# Patient Record
Sex: Female | Born: 1940 | Race: White | Hispanic: No | Marital: Married | Smoking: Former smoker
Health system: Southern US, Community
[De-identification: ages and names within clinical notes are randomized; demographics above are authoritative.]

## PROBLEM LIST (undated history)

## (undated) DIAGNOSIS — I10 Essential (primary) hypertension: Secondary | ICD-10-CM

## (undated) DIAGNOSIS — E119 Type 2 diabetes mellitus without complications: Secondary | ICD-10-CM

## (undated) HISTORY — PX: ABDOMINAL HYSTERECTOMY: SHX81

## (undated) HISTORY — PX: BACK SURGERY: SHX140

---

## 2004-03-02 ENCOUNTER — Ambulatory Visit: Payer: Self-pay | Admitting: Unknown Physician Specialty

## 2004-04-22 ENCOUNTER — Ambulatory Visit: Payer: Self-pay | Admitting: Surgery

## 2005-01-11 ENCOUNTER — Ambulatory Visit: Payer: Self-pay | Admitting: Ophthalmology

## 2005-03-06 ENCOUNTER — Ambulatory Visit: Payer: Self-pay | Admitting: Unknown Physician Specialty

## 2005-06-08 ENCOUNTER — Ambulatory Visit: Payer: Self-pay | Admitting: Gastroenterology

## 2005-07-26 ENCOUNTER — Ambulatory Visit: Payer: Self-pay | Admitting: Unknown Physician Specialty

## 2005-08-16 ENCOUNTER — Ambulatory Visit: Payer: Self-pay | Admitting: Unknown Physician Specialty

## 2006-01-17 ENCOUNTER — Ambulatory Visit: Payer: Self-pay | Admitting: Anesthesiology

## 2006-01-23 ENCOUNTER — Ambulatory Visit: Payer: Self-pay | Admitting: Anesthesiology

## 2006-03-07 ENCOUNTER — Ambulatory Visit: Payer: Self-pay | Admitting: Unknown Physician Specialty

## 2007-05-01 ENCOUNTER — Ambulatory Visit: Payer: Self-pay | Admitting: Unknown Physician Specialty

## 2008-05-04 ENCOUNTER — Ambulatory Visit: Payer: Self-pay | Admitting: Unknown Physician Specialty

## 2009-05-19 ENCOUNTER — Ambulatory Visit: Payer: Self-pay | Admitting: Ophthalmology

## 2009-09-14 ENCOUNTER — Ambulatory Visit: Payer: Self-pay | Admitting: Unknown Physician Specialty

## 2010-09-19 ENCOUNTER — Ambulatory Visit: Payer: Self-pay | Admitting: Unknown Physician Specialty

## 2011-10-11 ENCOUNTER — Ambulatory Visit: Payer: Self-pay | Admitting: Unknown Physician Specialty

## 2013-02-19 ENCOUNTER — Ambulatory Visit: Payer: Self-pay | Admitting: Internal Medicine

## 2014-02-26 DIAGNOSIS — E113299 Type 2 diabetes mellitus with mild nonproliferative diabetic retinopathy without macular edema, unspecified eye: Secondary | ICD-10-CM | POA: Insufficient documentation

## 2014-02-26 DIAGNOSIS — E559 Vitamin D deficiency, unspecified: Secondary | ICD-10-CM | POA: Insufficient documentation

## 2014-02-26 DIAGNOSIS — E1165 Type 2 diabetes mellitus with hyperglycemia: Secondary | ICD-10-CM | POA: Insufficient documentation

## 2014-02-26 DIAGNOSIS — I10 Essential (primary) hypertension: Secondary | ICD-10-CM | POA: Insufficient documentation

## 2014-05-11 DIAGNOSIS — D649 Anemia, unspecified: Secondary | ICD-10-CM | POA: Insufficient documentation

## 2014-05-29 DIAGNOSIS — E782 Mixed hyperlipidemia: Secondary | ICD-10-CM | POA: Insufficient documentation

## 2014-05-29 DIAGNOSIS — E538 Deficiency of other specified B group vitamins: Secondary | ICD-10-CM | POA: Insufficient documentation

## 2015-10-20 ENCOUNTER — Other Ambulatory Visit: Payer: Self-pay | Admitting: Internal Medicine

## 2015-10-20 DIAGNOSIS — Z1239 Encounter for other screening for malignant neoplasm of breast: Secondary | ICD-10-CM

## 2015-11-05 ENCOUNTER — Ambulatory Visit
Admission: RE | Admit: 2015-11-05 | Discharge: 2015-11-05 | Disposition: A | Discharge status: Home / Self Care | Payer: Medicare Other | Source: Ambulatory Visit | Attending: Internal Medicine | Admitting: Internal Medicine

## 2015-11-05 ENCOUNTER — Other Ambulatory Visit: Payer: Self-pay | Admitting: Internal Medicine

## 2015-11-05 DIAGNOSIS — Z1231 Encounter for screening mammogram for malignant neoplasm of breast: Secondary | ICD-10-CM | POA: Diagnosis present

## 2015-11-05 DIAGNOSIS — Z1239 Encounter for other screening for malignant neoplasm of breast: Secondary | ICD-10-CM

## 2016-02-04 ENCOUNTER — Other Ambulatory Visit: Payer: Self-pay | Admitting: Internal Medicine

## 2016-02-04 DIAGNOSIS — R1319 Other dysphagia: Secondary | ICD-10-CM

## 2016-02-28 ENCOUNTER — Ambulatory Visit
Admission: RE | Admit: 2016-02-28 | Discharge: 2016-02-28 | Disposition: A | Discharge status: Home / Self Care | Payer: Medicare Other | Source: Ambulatory Visit | Attending: Internal Medicine | Admitting: Internal Medicine

## 2016-02-28 DIAGNOSIS — R1310 Dysphagia, unspecified: Secondary | ICD-10-CM | POA: Insufficient documentation

## 2016-02-28 DIAGNOSIS — R1319 Other dysphagia: Secondary | ICD-10-CM

## 2016-02-28 NOTE — Therapy (Signed)
[facility] DIAGNOSTIC RADIOLOGY [address] [place], [place], [address] Phone: [phone]   Fax:     Modified Barium Swallow  Patient Details  Name: Rebecca PeersJustina R Ball MRN: [id number] Date of Birth: [birth year] No Data Recorded  Encounter Date: 02/28/2016      End of Session - 02/28/16 1349    Visit Number 1   Number of Visits 1   Date for SLP Re-Evaluation 02/28/16   SLP Start Time 1230   SLP Stop Time  1315   SLP Time Calculation (min) 45 min   Activity Tolerance Patient tolerated treatment well      No past medical history on file.  No past surgical history on file.  There were no vitals filed for this visit.   Subjective: Patient behavior: (alertness, ability to follow instructions, etc.): Patient is fully independent for cognition and communication  Chief complaint: Food sticking at mid-sternal level, primarily dry meat   Objective:  Radiological Procedure: A videoflouroscopic evaluation of oral-preparatory, reflex initiation, and pharyngeal phases of the swallow was performed; as well as a screening of the upper esophageal phase.  I. POSTURE: Upright in MBS chair  II. VIEW: Lateral  III. COMPENSATORY STRATEGIES: N/A  IV. BOLUSES ADMINISTERED:   Thin Liquid: 1 small, 5 rapid, consecutive   Nectar-thick Liquid: 1 moderate size    Puree: 3 teaspoon presentations   Mechanical Soft: 1/4 graham cracker in applesauce  V. RESULTS OF EVALUATION: A. ORAL PREPARATORY PHASE: (The lips, tongue, and velum are observed for strength and coordination)       **Overall Severity Rating: Within normal limits  B. SWALLOW INITIATION/REFLEX: (The reflex is normal if "triggered" by the time the bolus reached the base of the tongue)  **Overall Severity Rating: Within normal limits  C. PHARYNGEAL PHASE: (Pharyngeal function is normal if the bolus shows rapid, smooth, and continuous transit through the pharynx and there is no pharyngeal  residue after the swallow)  **Overall Severity Rating: Within normal limits  D. LARYNGEAL PENETRATION: (Material entering into the laryngeal inlet/vestibule but not aspirated) None  E. ASPIRATION: None  F. ESOPHAGEAL PHASE: (Screening of the upper esophagus) Slight coating of esophagus with barium impregnated applesauce  ASSESSMENT: This 75 year old woman, with complaint of food (primarily dry meats) sticking in her esophagus and requiring regurgitation, is presenting with normal oropharyngeal swallowing.  Oral control of the bolus including oral hold, rotary mastication, and anterior to posterior transfer are within normal limits. Timing of the pharyngeal swallow is within normal limits.  Aspects of the pharyngeal stage of swallowing including tongue base retraction, hyolaryngeal excursion, epiglottic inversion, duration/amplitude of UES opening, and laryngeal vestibule closure at the height of the swallow are within normal limits.  There is no pharyngeal residue.  There was no observed laryngeal penetration or aspiration.  The patient's symptoms do not appear to be due to oropharyngeal dysphagia.  Recommend referral to GI.  PLAN/RECOMMENDATIONS:   A. Diet: Regular; moisten and soften as needed   B. Swallowing Precautions: Avoid foods that get stuck   C. Recommended consultation to: GI   D. Therapy recommendations: N/A   E. Results and recommendations were discussed with the patient immediately following the study and the final report routed to the referring MD.      Esophageal dysphagia - Plan: DG OP Swallowing Func-Medicare/Speech Path, DG OP Swallowing Func-Medicare/Speech Path      G-Codes - 02/28/16 1350    Functional Assessment Tool Used MBS, clinical judgment  Functional Limitations Swallowing   Swallow Current Status [phone](G8996) At least 1 percent but less than 20 percent impaired, limited or restricted   Swallow Goal Status ([id number](G8997) At least 1 percent but less than 20 percent  impaired, limited or restricted   Swallow Discharge Status [phone](G8998) At least 1 percent but less than 20 percent impaired, limited or restricted          Problem List There are no active problems to display for this patient.  Dollene PrimroseSusan G Charon Smedberg, MS/CCC- SLP  Leandrew KoyanagiAbernathy, Susie 02/28/2016, 1:51 PM  [facility] DIAG[facility]STIC RADIOLOGY [address] [place], [place], [address] Phone: [phone]   Fax:     Name: Rebecca PeersJustina R Ball MRN: [id number] Date of Birth: [birth year]

## 2016-06-08 DIAGNOSIS — S32030A Wedge compression fracture of third lumbar vertebra, initial encounter for closed fracture: Secondary | ICD-10-CM | POA: Insufficient documentation

## 2016-06-08 DIAGNOSIS — M8588 Other specified disorders of bone density and structure, other site: Secondary | ICD-10-CM | POA: Insufficient documentation

## 2016-06-08 DIAGNOSIS — M5442 Lumbago with sciatica, left side: Secondary | ICD-10-CM | POA: Insufficient documentation

## 2016-12-15 DIAGNOSIS — E7800 Pure hypercholesterolemia, unspecified: Secondary | ICD-10-CM | POA: Insufficient documentation

## 2017-07-02 ENCOUNTER — Inpatient Hospital Stay
Admission: EM | Admit: 2017-07-02 | Discharge: 2017-07-07 | DRG: 853 | Disposition: A | Discharge status: Home Health | Payer: Medicare Other | Attending: Internal Medicine | Admitting: Internal Medicine

## 2017-07-02 ENCOUNTER — Emergency Department: Payer: Medicare Other

## 2017-07-02 ENCOUNTER — Encounter: Payer: Self-pay | Admitting: Emergency Medicine

## 2017-07-02 ENCOUNTER — Other Ambulatory Visit: Payer: Self-pay

## 2017-07-02 DIAGNOSIS — Z7982 Long term (current) use of aspirin: Secondary | ICD-10-CM | POA: Diagnosis not present

## 2017-07-02 DIAGNOSIS — E860 Dehydration: Secondary | ICD-10-CM | POA: Diagnosis present

## 2017-07-02 DIAGNOSIS — N179 Acute kidney failure, unspecified: Secondary | ICD-10-CM | POA: Diagnosis not present

## 2017-07-02 DIAGNOSIS — Z794 Long term (current) use of insulin: Secondary | ICD-10-CM | POA: Diagnosis not present

## 2017-07-02 DIAGNOSIS — K612 Anorectal abscess: Secondary | ICD-10-CM | POA: Diagnosis present

## 2017-07-02 DIAGNOSIS — Z888 Allergy status to other drugs, medicaments and biological substances status: Secondary | ICD-10-CM

## 2017-07-02 DIAGNOSIS — E1110 Type 2 diabetes mellitus with ketoacidosis without coma: Secondary | ICD-10-CM | POA: Diagnosis present

## 2017-07-02 DIAGNOSIS — E785 Hyperlipidemia, unspecified: Secondary | ICD-10-CM | POA: Diagnosis present

## 2017-07-02 DIAGNOSIS — A419 Sepsis, unspecified organism: Principal | ICD-10-CM | POA: Diagnosis present

## 2017-07-02 DIAGNOSIS — I10 Essential (primary) hypertension: Secondary | ICD-10-CM | POA: Diagnosis present

## 2017-07-02 DIAGNOSIS — Z8619 Personal history of other infectious and parasitic diseases: Secondary | ICD-10-CM

## 2017-07-02 DIAGNOSIS — N170 Acute kidney failure with tubular necrosis: Secondary | ICD-10-CM | POA: Diagnosis present

## 2017-07-02 DIAGNOSIS — L0231 Cutaneous abscess of buttock: Secondary | ICD-10-CM | POA: Diagnosis not present

## 2017-07-02 DIAGNOSIS — E0810 Diabetes mellitus due to underlying condition with ketoacidosis without coma: Secondary | ICD-10-CM | POA: Diagnosis not present

## 2017-07-02 DIAGNOSIS — H9190 Unspecified hearing loss, unspecified ear: Secondary | ICD-10-CM | POA: Diagnosis present

## 2017-07-02 DIAGNOSIS — Z885 Allergy status to narcotic agent status: Secondary | ICD-10-CM | POA: Diagnosis not present

## 2017-07-02 DIAGNOSIS — A46 Erysipelas: Secondary | ICD-10-CM

## 2017-07-02 DIAGNOSIS — Z6836 Body mass index (BMI) 36.0-36.9, adult: Secondary | ICD-10-CM

## 2017-07-02 DIAGNOSIS — L03317 Cellulitis of buttock: Secondary | ICD-10-CM | POA: Diagnosis not present

## 2017-07-02 DIAGNOSIS — E876 Hypokalemia: Secondary | ICD-10-CM | POA: Diagnosis present

## 2017-07-02 HISTORY — DX: Essential (primary) hypertension: I10

## 2017-07-02 HISTORY — DX: Type 2 diabetes mellitus without complications: E11.9

## 2017-07-02 LAB — BASIC METABOLIC PANEL
ANION GAP: 16 — AB (ref 5–15)
BUN: 65 mg/dL — AB (ref 6–20)
CHLORIDE: 91 mmol/L — AB (ref 101–111)
CO2: 21 mmol/L — ABNORMAL LOW (ref 22–32)
Calcium: 8.2 mg/dL — ABNORMAL LOW (ref 8.9–10.3)
Creatinine, Ser: 3.26 mg/dL — ABNORMAL HIGH (ref 0.44–1.00)
GFR, EST AFRICAN AMERICAN: 15 mL/min — AB (ref >60)
GFR, EST NON AFRICAN AMERICAN: 13 mL/min — AB (ref >60)
Glucose, Bld: 269 mg/dL — ABNORMAL HIGH (ref 65–99)
POTASSIUM: 2.8 mmol/L — AB (ref 3.5–5.1)
SODIUM: 128 mmol/L — AB (ref 135–145)

## 2017-07-02 LAB — CBC WITH DIFFERENTIAL/PLATELET
BASOS ABS: 0 10*3/uL (ref 0–0.1)
Basophils Relative: 0 %
EOS PCT: 0 %
Eosinophils Absolute: 0 10*3/uL (ref 0–0.7)
HEMATOCRIT: 35.6 % (ref 35.0–47.0)
HEMOGLOBIN: 11.2 g/dL — AB (ref 12.0–16.0)
LYMPHS ABS: 0.5 10*3/uL — AB (ref 1.0–3.6)
LYMPHS PCT: 3 %
MCH: 22.7 pg — ABNORMAL LOW (ref 26.0–34.0)
MCHC: 31.5 g/dL — ABNORMAL LOW (ref 32.0–36.0)
MCV: 72.2 fL — ABNORMAL LOW (ref 80.0–100.0)
Monocytes Absolute: 0.8 10*3/uL (ref 0.2–0.9)
Monocytes Relative: 4 %
NEUTROS ABS: 18.7 10*3/uL — AB (ref 1.4–6.5)
NEUTROS PCT: 93 %
PLATELETS: 302 10*3/uL (ref 150–440)
RBC: 4.93 MIL/uL (ref 3.80–5.20)
RDW: 17 % — ABNORMAL HIGH (ref 11.5–14.5)
WBC: 20.1 10*3/uL — AB (ref 3.6–11.0)

## 2017-07-02 LAB — LACTIC ACID, PLASMA: Lactic Acid, Venous: 2.6 mmol/L (ref 0.5–1.9)

## 2017-07-02 MED ORDER — ONDANSETRON HCL 4 MG/2ML IJ SOLN
4.0000 mg | Freq: Once | INTRAMUSCULAR | Status: AC
  Administered 2017-07-02: 4 mg via INTRAVENOUS
  Filled 2017-07-02: qty 2

## 2017-07-02 MED ORDER — SODIUM CHLORIDE 0.9 % IV BOLUS (SEPSIS)
1000.0000 mL | Freq: Once | INTRAVENOUS | Status: AC
  Administered 2017-07-02: 1000 mL via INTRAVENOUS

## 2017-07-02 MED ORDER — VANCOMYCIN HCL IN DEXTROSE 1-5 GM/200ML-% IV SOLN
1000.0000 mg | Freq: Once | INTRAVENOUS | Status: AC
  Administered 2017-07-02: 1000 mg via INTRAVENOUS
  Filled 2017-07-02: qty 200

## 2017-07-02 MED ORDER — VANCOMYCIN HCL 10 G IV SOLR: 2000.0000 mg | Freq: Once | INTRAVENOUS | Status: DC

## 2017-07-02 MED ORDER — INSULIN ASPART 100 UNIT/ML ~~LOC~~ SOLN: 0.0000 [IU] | Freq: Every day | SUBCUTANEOUS | Status: DC

## 2017-07-02 MED ORDER — VANCOMYCIN HCL IN DEXTROSE 750-5 MG/150ML-% IV SOLN
750.0000 mg | INTRAVENOUS | Status: DC
  Filled 2017-07-02: qty 150

## 2017-07-02 NOTE — ED Notes (Signed)
See triage note  Presents with abscess to buttocks  States the area started to drain 1-2 days ago  Large red swollen area noted to inside right buttocks

## 2017-07-02 NOTE — Progress Notes (Addendum)
Pharmacy Antibiotic Note  [name] is a 77 y.o. female admitted on 07/02/2017 with cellulitis with abscess.  Pharmacy has been consulted for vancomycin and Zosyn dosing.  Plan: DW 70kg  Vd 49L kei 0.018 hr-1  T1/2 39 hours Vancomycin 750 mg q 36 hours ordered with stacked dosing. Level before 4th dose. Goal trough 15-20.  Zosyn 3.375 grams q 12 hours ordered.  Height: 5\' 4"  (162.6 cm) Weight: 206 lb (93.4 kg) IBW/kg (Calculated) : 54.7  Temp (24hrs), Avg:99.2 F (37.3 C), Min:98.9 F (37.2 C), Max:99.4 F (37.4 C)  Recent Labs  Lab 07/02/17 1813 07/02/17 1909  WBC 20.1*  --   CREATININE 3.26*  --   LATICACIDVEN  --  2.6*    Estimated Creatinine Clearance: 16.3 mL/min (A) (by C-G formula based on SCr of 3.26 mg/dL (H)).    Allergies  Allergen Reactions  . Penicillins Anaphylaxis  . Oxycodone Nausea And Vomiting  . Propoxyphene Nausea And Vomiting    Antimicrobials this admission: Vancomycin 3/18  >>    >>   Dose adjustments this admission:   Microbiology results: 3/18 BCx: pending 3/18 WoundCx: pending    Thank you for allowing pharmacy to be a part of this patient's care.  Daralyn Bert S 07/02/2017 11:56 PM

## 2017-07-02 NOTE — ED Provider Notes (Signed)
[facility] Emergency Department Provider Note ____________________________________________  Time seen: 1732  I have reviewed the triage vital signs and the nursing notes.  HISTORY  Chief Complaint  Abscess  HPI Rebecca Ball is a 77 y.o. female presents to the ED [facility]  The patient presented herself there initially for evaluation of a right buttocks abscess.  Patient describes there is been there for approximately a week.  In that time she is noted nausea, vomiting, fever, and chills.  She notes the area is weeping at this time.  She denies any chest pain or shortness of breath.  Patient has attempted to take over-the-counter Tylenol or Motrin but notes that her nausea and has been severe.  She is also had decreased oral intake secondary to it.  She describes a remote history of staph infection when she was a child.  She denies any recurrent abscesses or cellulitis to the buttocks.  Past Medical History:  Diagnosis Date  . Diabetes mellitus without complication (HCC)   . Hypertension    There are no active problems to display for this patient.  History reviewed. No pertinent surgical history.  Prior to Admission medications   Medication Sig Start Date End Date Taking? Authorizing Provider  aspirin 81 MG chewable tablet Chew 81 mg by mouth daily.   Yes [provider]  hydrochlorothiazide (HYDRODIURIL) 25 MG tablet Take 25 mg by mouth daily.   Yes [provider]  insulin aspart (NOVOLOG) 100 UNIT/ML injection Inject into the skin 3 (three) times daily before meals.   Yes [provider]  metFORMIN (GLUCOPHAGE) 1000 MG tablet Take 1,000 mg by mouth 2 (two) times daily with a meal.   Yes [provider]  metoprolol succinate (TOPROL XL) 25 MG 24 hr tablet Take 25 mg by mouth daily.   Yes [provider]  simvastatin (ZOCOR) 20 MG tablet Take 20 mg by mouth daily.   Yes [provider]    Allergies Penicillins and Oxycodone  Family History  Problem Relation Age of Onset  . Breast cancer Paternal Aunt     Social History Social History   Tobacco Use  . Smoking status: Not on file  Substance Use Topics  . Alcohol use: Not on file  . Drug use: Not on file    Review of Systems  Constitutional: Positive for fever. Cardiovascular: Negative for chest pain. Respiratory: Negative for shortness of breath. Gastrointestinal: Negative for abdominal pain and diarrhea. Reports nausea and vomiting . Genitourinary: Negative for dysuria. Musculoskeletal: Negative for back pain. Skin: Negative for rash.  Right buttocks abscess as above. Neurological: Negative for headaches, focal weakness or numbness. ____________________________________________  PHYSICAL EXAM:  VITAL SIGNS: ED Triage Vitals  Enc Vitals Group     BP 07/02/17 1657 117/61     Pulse Rate 07/02/17 1657 (!) 102     Resp 07/02/17 1657 18     Temp 07/02/17 1657 98.9 F (37.2 C)     Temp Source 07/02/17 1657 Oral     SpO2 07/02/17 1657 96 %     Weight 07/02/17 1658 206 lb (93.4 kg)     Height 07/02/17 1658 5\' 4"  (1.626 m)     Head Circumference --      Peak Flow --      Pain Score 07/02/17 1658 6     Pain Loc --      Pain Edu? --      Excl. in  GC? --     Constitutional: Alert and oriented. Well appearing and in no distress. Head: Normocephalic and atraumatic. Eyes: Conjunctivae are normal. Normal extraocular movements Cardiovascular: Normal rate, regular rhythm. Normal distal pulses. Respiratory: Normal respiratory effort. No wheezes/rales/rhonchi. Gastrointestinal: Soft and nontender. No distention. Musculoskeletal: Nontender with normal range of motion in all extremities.  Neurologic:  Normal gait without ataxia. Normal speech and language. No gross focal neurologic deficits are appreciated. Skin:  Skin is warm, dry and intact. No rash noted.  Right buttocks is noted to have a  large, firm, well demarcated area of in duration and erythema.  The erythema extends from the gluteal cleft near the rectum and perineum through the to the lateral aspect of the right buttocks.  There is also noted to be some necrotic appearing tissue near the rectum.  The skin is weeping.  No focal abscess, pointing, or fluctuance is appreciated. ____________________________________________   LABS (pertinent positives/negatives) Labs Reviewed  BASIC METABOLIC PANEL - Abnormal; Notable for the following components:      Result Value   Sodium 128 (*)    Potassium 2.8 (*)    Chloride 91 (*)    CO2 21 (*)    Glucose, Bld 269 (*)    BUN 65 (*)    Creatinine, Ser 3.26 (*)    Calcium 8.2 (*)    GFR calc non Af Amer 13 (*)    GFR calc Af Amer 15 (*)    Anion gap 16 (*)    All other components within normal limits  CBC WITH DIFFERENTIAL/PLATELET - Abnormal; Notable for the following components:   WBC 20.1 (*)    Hemoglobin 11.2 (*)    MCV 72.2 (*)    MCH 22.7 (*)    MCHC 31.5 (*)    RDW 17.0 (*)    Neutro Abs 18.7 (*)    Lymphs Abs 0.5 (*)    All other components within normal limits  LACTIC ACID, PLASMA - Abnormal; Notable for the following components:   Lactic Acid, Venous 2.6 (*)    All other components within normal limits  CULTURE, BLOOD (ROUTINE X 2)  CULTURE, BLOOD (ROUTINE X 2)  AEROBIC CULTURE (SUPERFICIAL SPECIMEN)  LACTIC ACID, PLASMA  URINALYSIS, COMPLETE (UACMP) WITH MICROSCOPIC  ____________________________________________   RADIOLOGY  US Pelvic Ltd  IMPRESSION: 1.1 x 1.0 x 1.1 cm complex hypoechoic collection positioned within the medial aspect of the right buttock, approximately 4-5 mm deep to the skin, with associated linear tract extending towards the skin superficially. Finding most consistent with a small abscess with draining sinus tract. Surrounding soft tissue swelling edema suggestive of associated  cellulitis. ____________________________________________  PROCEDURES  Procedures Zofran 4 mg IVP NS 1000 ml IVP Vancomycin 1000 mg IVPB ____________________________________________  INITIAL IMPRESSION / ASSESSMENT AND PLAN / ED COURSE  Patient with ED evaluation of a one-week complaint of a right buttocks abscess and sialitis.  Patient was found on exam to have a large cellulitis to the right buttocks on presentation.  Her labs revealed an acute renal failure a leukocytosis and elevated lactic acid.  Ultrasound of the area in question reveals a very small central abscess with surrounding cellulitis.  As such, plan of care is to have the patient admitted to the hospitalist service for IV antibiotics and fluid hydration. ____________________________________________  FINAL CLINICAL IMPRESSION(S) / ED DIAGNOSES  Final diagnoses:  Cellulitis of buttock  Erysipelas  Cellulitis and abscess of buttock  Acute renal failure, unspecified acute renal failure type (  HCC)      Anastasios Melander, Charlesetta Ivory, PA-C 07/02/17 2122    Phineas Semen, MD 07/02/17 [phone]

## 2017-07-02 NOTE — ED Notes (Signed)
Lab called with a critical high lactic acid of 2.6. PA was notified.

## 2017-07-02 NOTE — ED Triage Notes (Signed)
Abscess R buttock x 1 week. States is draining

## 2017-07-02 NOTE — H&P (Signed)
[facility] [place] [facility]   PATIENT NAME: Rebecca Ball    MR#:  [id number]  DATE OF BIRTH:  1940/09/11  DATE OF ADMISSION:  07/02/2017  PRIMARY CARE PHYSICIAN: Leotis ShamesSingh, Jasmine, MD   REQUESTING/REFERRING PHYSICIAN:   CHIEF COMPLAINT:   Chief Complaint  Patient presents with  . Abscess    HISTORY OF PRESENT ILLNESS: Rebecca SkinnerJustina Frimpong  is a 77 y.o. female with a known history of DM2 and HTN. She presented to emergency room for right buttock swelling and tenderness, going on for the past week or so.  Patient denies any recent falls or trauma to the area.  She admits to subjective fever and chills at home, she did not check her temperature. Her p.o. intake has been poor for the past 3-4 days, due to not feeling well.  Patient has not been taking her metformin and insulin doses as she is supposed to, as she was afraid that the blood sugar would drop due to poor p.o. Intake.  She tried ibuprofen and Tylenol without any relief at home. Upon evaluation in the emergency room, she was found in sepsis and DKA. Blood test done emergency room are remarkable for elevated WBC at 20,000, lactic acid 2.6, anion gap at 16.  Creatinine is 3.26 and BUN is 65.  Sodium is 128 and potassium was 2.8.  Blood sugar is elevated at 269. Pelvis ultrasound, reviewed by myself, shows 1.1 x 1.0 x 1.1 cm complex hypoechoic collection positioned within the medial aspect of the right buttock, approximately 4-5 mm deep to the skin, with associated linear tract extending towards the skin superficially. Patient is admitted for further evaluation and treatment.  PAST MEDICAL HISTORY:   Past Medical History:  Diagnosis Date  . Diabetes mellitus without complication (HCC)   . Hypertension     PAST SURGICAL HISTORY: History reviewed. No pertinent surgical history.  SOCIAL HISTORY:  Social History   Tobacco Use  . Smoking status: Not on file  Substance Use Topics  . Alcohol use: Not on  file    FAMILY HISTORY:  Family History  Problem Relation Age of Onset  . Breast cancer Paternal Aunt     DRUG ALLERGIES:  Allergies  Allergen Reactions  . Penicillins Anaphylaxis  . Oxycodone Nausea And Vomiting  . Propoxyphene Nausea And Vomiting    REVIEW OF SYSTEMS:   CONSTITUTIONAL: Positive for subjective fever/chills, fatigue and generalized weakness. EYES: No vision changes.  EARS, NOSE, AND THROAT: No tinnitus or ear pain.  RESPIRATORY: No cough, shortness of breath, wheezing or hemoptysis.  CARDIOVASCULAR: No chest pain, orthopnea, edema.  GASTROINTESTINAL: No nausea, vomiting, diarrhea or abdominal pain.  GENITOURINARY: No dysuria, hematuria.  ENDOCRINE: No polyuria, nocturia,  HEMATOLOGY: No bleeding SKIN: RT buttock tenderness and swelling. MUSCULOSKELETAL: No joint pain or arthritis.   NEUROLOGIC: No focal weakness.  PSYCHIATRY: No anxiety or depression.   MEDICATIONS AT HOME:  Prior to Admission medications   Medication Sig Start Date End Date Taking? Authorizing Provider  aspirin 81 MG chewable tablet Chew 81 mg by mouth daily.   Yes [provider]  hydrochlorothiazide (HYDRODIURIL) 25 MG tablet Take 25 mg by mouth daily.   Yes [provider]  insulin aspart (NOVOLOG) 100 UNIT/ML injection Inject into the skin 3 (three) times daily before meals.   Yes [provider]  metFORMIN (GLUCOPHAGE) 1000 MG tablet Take 1,000 mg by mouth 2 (two) times daily with a meal.   Yes [provider]  metoprolol succinate (TOPROL XL) 25 MG 24 hr tablet Take 25 mg by mouth daily.   Yes [provider]  simvastatin (ZOCOR) 20 MG tablet Take 20 mg by mouth daily.   Yes [provider]      PHYSICAL EXAMINATION:   VITAL SIGNS: Blood pressure (!) 105/49, pulse 96, temperature 99.4 F (37.4 C), temperature source Oral, resp. rate 18, height 5\' 4"  (1.626 m), weight 93.4 kg (206 lb), SpO2 98 %.  GENERAL:  77 y.o.-year-old  patient lying in the bed with no acute distress.  EYES: Pupils equal, round, reactive to light and accommodation. No scleral icterus.  HEENT: Head atraumatic, normocephalic. Oropharynx and nasopharynx clear.  NECK:  Supple, no jugular venous distention. No thyroid enlargement, no tenderness.  LUNGS: Reduced breath sounds bilaterally, no wheezing, rales,rhonchi or crepitation. No use of accessory muscles of respiration.  CARDIOVASCULAR: S1, S2 normal. No S3/S4.  ABDOMEN: Soft, nontender, nondistended. Bowel sounds present. No organomegaly or mass.  EXTREMITIES: No pedal edema, cyanosis, or clubbing.  NEUROLOGIC: No focal weakness. Gait not checked, due to generalized weakness.  PSYCHIATRIC: The patient is alert and oriented x 3.  SKIN: Right buttock is noted with a large area of erythema and induration extending from the gluteal cleft to the lateral aspect of the right buttock.  The skin is weeping, but no bleeding or discharge noted.  No focal abscess or fluctuance appreciated by palpation.  LABORATORY PANEL:   CBC Recent Labs  Lab 07/02/17 1813  WBC 20.1*  HGB 11.2*  HCT 35.6  PLT 302  MCV 72.2*  MCH 22.7*  MCHC 31.5*  RDW 17.0*  LYMPHSABS 0.5*  MONOABS 0.8  EOSABS 0.0  BASOSABS 0.0   ------------------------------------------------------------------------------------------------------------------  Chemistries  Recent Labs  Lab 07/02/17 1813  NA 128*  K 2.8*  CL 91*  CO2 21*  GLUCOSE 269*  BUN 65*  CREATININE 3.26*  CALCIUM 8.2*   ------------------------------------------------------------------------------------------------------------------ estimated creatinine clearance is 16.3 mL/min (A) (by C-G formula based on SCr of 3.26 mg/dL (H)). ------------------------------------------------------------------------------------------------------------------ No results for input(s): TSH, T4TOTAL, T3FREE, THYROIDAB in the last 72 hours.  Invalid input(s):  FREET3   Coagulation profile No results for input(s): INR, PROTIME in the last 168 hours. ------------------------------------------------------------------------------------------------------------------- No results for input(s): DDIMER in the last 72 hours. -------------------------------------------------------------------------------------------------------------------  Cardiac Enzymes No results for input(s): CKMB, TROPONINI, MYOGLOBIN in the last 168 hours.  Invalid input(s): CK ------------------------------------------------------------------------------------------------------------------ Invalid input(s): POCBNP  ---------------------------------------------------------------------------------------------------------------  Urinalysis No results found for: COLORURINE, APPEARANCEUR, LABSPEC, PHURINE, GLUCOSEU, HGBUR, BILIRUBINUR, KETONESUR, PROTEINUR, UROBILINOGEN, NITRITE, LEUKOCYTESUR   RADIOLOGY: US Pelvis Limited (transabdominal Only)  Result Date: 07/02/2017 CLINICAL DATA:  Initial evaluation for right buttock abscess for 1 week. EXAM: LIMITED ULTRASOUND OF PELVIS TECHNIQUE: Limited transabdominal ultrasound examination of the pelvis was performed. COMPARISON:  Prior CT from 08/16/2005. FINDINGS: Targeted ultrasound of in area of concern at the medial aspect of the right buttock was performed. Ultrasound demonstrates a complex hypoechoic area/collection measuring 1.1 x 1.0 x 1.1 cm. There appears to be a communicating sinus tract extending towards the skin. Collection is positioned approximately 4-5 mm deep to the skin. No other discrete fluid collections identified. Per technologist note, patient's entire right buttock is erythematous and swollen, suggesting cellulitis. Ill-defined soft tissue edema is seen throughout the remaining visualized areas of the right buttock. IMPRESSION: 1.1 x 1.0 x 1.1 cm complex hypoechoic collection positioned within the medial aspect of the  right buttock, approximately 4-5 mm deep to the skin, with associated linear tract  extending towards the skin superficially. Finding most consistent with a small abscess with draining sinus tract. Surrounding soft tissue swelling edema suggestive of associated cellulitis. Electronically Signed   By: Rise Mu M.D.   On: 07/02/2017 20:55    EKG: No orders found for this or any previous visit.  IMPRESSION AND PLAN:  1.  Sepsis, secondary to right buttock abscess.  Will start patient on antibiotics, Zosyn and vancomycin.  We will start fluid resuscitation.  Admit patient to intensive care unit.  Continue to monitor clinically closely and follow lactic acid level.  2.  Right buttock abscess, will start IV antibiotics Zosyn and vancomycin.  Infectious disease and general surgery are consulted for further evaluation and treatment. 3.  DKA.  Will start patient on insulin drip and admitted to intensive care unit for close monitoring.  4.  Acute renal failure, likely prerenal, related to dehydration and infectious process.  We will start aggressive IV hydration and monitor kidney function closely.  Avoid nephrotoxic medications.  5.  Hypertension, stable, restart home medications. 6   Hyperlipidemia.  Continue statin therapy.  All the records are reviewed and case discussed with ED provider. Management plans discussed with the patient, family and they are in agreement.  CODE STATUS: Code Status History    This patient does not have a recorded code status. Please follow your organizational policy for patients in this situation.       TOTAL TIME TAKING CARE OF THIS PATIENT: 50 minutes.    Cammy Copa M.D on 07/02/2017 at 11:16 PM  Between 7am to 6pm - Pager - [phone]  After 6pm go to [email or web address] - password [email or web address] [facility]  [facility]  Office  [phone]  CC: Primary care physician; Leotis Shames, MD

## 2017-07-03 ENCOUNTER — Encounter: Payer: Self-pay | Admitting: *Deleted

## 2017-07-03 ENCOUNTER — Other Ambulatory Visit: Payer: Self-pay

## 2017-07-03 DIAGNOSIS — E0810 Diabetes mellitus due to underlying condition with ketoacidosis without coma: Secondary | ICD-10-CM

## 2017-07-03 DIAGNOSIS — N179 Acute kidney failure, unspecified: Secondary | ICD-10-CM

## 2017-07-03 DIAGNOSIS — E1110 Type 2 diabetes mellitus with ketoacidosis without coma: Secondary | ICD-10-CM | POA: Diagnosis present

## 2017-07-03 LAB — CBC
HCT: 32.4 % — ABNORMAL LOW (ref 35.0–47.0)
Hemoglobin: 10.2 g/dL — ABNORMAL LOW (ref 12.0–16.0)
MCH: 22.8 pg — AB (ref 26.0–34.0)
MCHC: 31.5 g/dL — AB (ref 32.0–36.0)
MCV: 72.4 fL — AB (ref 80.0–100.0)
PLATELETS: 256 10*3/uL (ref 150–440)
RBC: 4.47 MIL/uL (ref 3.80–5.20)
RDW: 16.8 % — ABNORMAL HIGH (ref 11.5–14.5)
WBC: 15.2 10*3/uL — ABNORMAL HIGH (ref 3.6–11.0)

## 2017-07-03 LAB — GLUCOSE, CAPILLARY
GLUCOSE-CAPILLARY: 136 mg/dL — AB (ref 65–99)
GLUCOSE-CAPILLARY: 156 mg/dL — AB (ref 65–99)
GLUCOSE-CAPILLARY: 162 mg/dL — AB (ref 65–99)
GLUCOSE-CAPILLARY: 172 mg/dL — AB (ref 65–99)
GLUCOSE-CAPILLARY: 182 mg/dL — AB (ref 65–99)
Glucose-Capillary: 144 mg/dL — ABNORMAL HIGH (ref 65–99)
Glucose-Capillary: 152 mg/dL — ABNORMAL HIGH (ref 65–99)
Glucose-Capillary: 126 mg/dL — ABNORMAL HIGH (ref 65–99)
Glucose-Capillary: 137 mg/dL — ABNORMAL HIGH (ref 65–99)
Glucose-Capillary: 178 mg/dL — ABNORMAL HIGH (ref 65–99)
Glucose-Capillary: 189 mg/dL — ABNORMAL HIGH (ref 65–99)
Glucose-Capillary: 224 mg/dL — ABNORMAL HIGH (ref 65–99)

## 2017-07-03 LAB — BASIC METABOLIC PANEL
Anion gap: 14 (ref 5–15)
Anion gap: 12 (ref 5–15)
BUN: 64 mg/dL — ABNORMAL HIGH (ref 6–20)
BUN: 62 mg/dL — AB (ref 6–20)
CALCIUM: 7.4 mg/dL — AB (ref 8.9–10.3)
CO2: 19 mmol/L — AB (ref 22–32)
CO2: 20 mmol/L — ABNORMAL LOW (ref 22–32)
CREATININE: 2.76 mg/dL — AB (ref 0.44–1.00)
CREATININE: 2.55 mg/dL — AB (ref 0.44–1.00)
Calcium: 7.6 mg/dL — ABNORMAL LOW (ref 8.9–10.3)
Chloride: 97 mmol/L — ABNORMAL LOW (ref 101–111)
Chloride: 103 mmol/L (ref 101–111)
GFR calc non Af Amer: 16 mL/min — ABNORMAL LOW (ref >60)
GFR calc non Af Amer: 17 mL/min — ABNORMAL LOW (ref >60)
GFR, EST AFRICAN AMERICAN: 18 mL/min — AB (ref >60)
GFR, EST AFRICAN AMERICAN: 20 mL/min — AB (ref >60)
GLUCOSE: 174 mg/dL — AB (ref 65–99)
Glucose, Bld: 146 mg/dL — ABNORMAL HIGH (ref 65–99)
POTASSIUM: 3 mmol/L — AB (ref 3.5–5.1)
Potassium: 2.7 mmol/L — CL (ref 3.5–5.1)
SODIUM: 135 mmol/L (ref 135–145)
Sodium: 130 mmol/L — ABNORMAL LOW (ref 135–145)

## 2017-07-03 LAB — HEMOGLOBIN A1C
HEMOGLOBIN A1C: 6.8 % — AB (ref 4.8–5.6)
Mean Plasma Glucose: 148.46 mg/dL

## 2017-07-03 LAB — LACTIC ACID, PLASMA: Lactic Acid, Venous: 1.7 mmol/L (ref 0.5–1.9)

## 2017-07-03 LAB — MRSA PCR SCREENING: MRSA by PCR: NEGATIVE

## 2017-07-03 LAB — MAGNESIUM: Magnesium: 1.4 mg/dL — ABNORMAL LOW (ref 1.7–2.4)

## 2017-07-03 LAB — PROCALCITONIN: Procalcitonin: 4.48 ng/mL

## 2017-07-03 MED ORDER — ASPIRIN 81 MG PO CHEW
81.0000 mg | CHEWABLE_TABLET | Freq: Every day | ORAL | Status: DC
  Administered 2017-07-03 – 2017-07-07 (×5): 81 mg via ORAL
  Filled 2017-07-03 (×5): qty 1

## 2017-07-03 MED ORDER — SODIUM CHLORIDE 0.9 % IV SOLN: INTRAVENOUS | Status: DC

## 2017-07-03 MED ORDER — PREMIER PROTEIN SHAKE
11.0000 [oz_av] | Freq: Two times a day (BID) | ORAL | Status: DC
  Administered 2017-07-04 – 2017-07-07 (×7): 11 [oz_av] via ORAL

## 2017-07-03 MED ORDER — INSULIN ASPART 100 UNIT/ML ~~LOC~~ SOLN: 0.0000 [IU] | Freq: Three times a day (TID) | SUBCUTANEOUS | Status: DC

## 2017-07-03 MED ORDER — SODIUM CHLORIDE 0.9 % IV SOLN
INTRAVENOUS | Status: DC
  Administered 2017-07-03 – 2017-07-04 (×3): via INTRAVENOUS

## 2017-07-03 MED ORDER — DEXTROSE-NACL 5-0.45 % IV SOLN
INTRAVENOUS | Status: DC
  Administered 2017-07-03: 01:00:00 via INTRAVENOUS

## 2017-07-03 MED ORDER — METOPROLOL SUCCINATE ER 25 MG PO TB24
25.0000 mg | ORAL_TABLET | Freq: Every day | ORAL | Status: DC
  Administered 2017-07-03 – 2017-07-07 (×3): 25 mg via ORAL
  Filled 2017-07-03 (×5): qty 1

## 2017-07-03 MED ORDER — ACETAMINOPHEN 650 MG RE SUPP: 650.0000 mg | Freq: Four times a day (QID) | RECTAL | Status: DC | PRN

## 2017-07-03 MED ORDER — INSULIN GLARGINE 100 UNIT/ML ~~LOC~~ SOLN
10.0000 [IU] | Freq: Every day | SUBCUTANEOUS | Status: DC
  Administered 2017-07-03 – 2017-07-05 (×3): 10 [IU] via SUBCUTANEOUS
  Filled 2017-07-03 (×4): qty 0.1

## 2017-07-03 MED ORDER — HYDROCODONE-ACETAMINOPHEN 5-325 MG PO TABS
1.0000 | ORAL_TABLET | ORAL | Status: DC | PRN
  Administered 2017-07-04 – 2017-07-05 (×3): 1 via ORAL
  Filled 2017-07-03 (×2): qty 1
  Filled 2017-07-03 (×2): qty 2

## 2017-07-03 MED ORDER — PIPERACILLIN-TAZOBACTAM 3.375 G IVPB
3.3750 g | Freq: Two times a day (BID) | INTRAVENOUS | Status: DC
  Administered 2017-07-03 – 2017-07-05 (×5): 3.375 g via INTRAVENOUS
  Filled 2017-07-03 (×5): qty 50

## 2017-07-03 MED ORDER — METFORMIN HCL 500 MG PO TABS: 1000.0000 mg | ORAL_TABLET | Freq: Two times a day (BID) | ORAL | Status: DC

## 2017-07-03 MED ORDER — TRAZODONE HCL 50 MG PO TABS
25.0000 mg | ORAL_TABLET | Freq: Every evening | ORAL | Status: DC | PRN
  Administered 2017-07-05: 25 mg via ORAL
  Filled 2017-07-03: qty 1

## 2017-07-03 MED ORDER — INSULIN ASPART 100 UNIT/ML ~~LOC~~ SOLN
0.0000 [IU] | Freq: Every day | SUBCUTANEOUS | Status: DC
  Administered 2017-07-03: 2 [IU] via SUBCUTANEOUS
  Administered 2017-07-06: 4 [IU] via SUBCUTANEOUS
  Filled 2017-07-03 (×2): qty 1

## 2017-07-03 MED ORDER — VANCOMYCIN HCL IN DEXTROSE 750-5 MG/150ML-% IV SOLN
750.0000 mg | INTRAVENOUS | Status: DC
  Administered 2017-07-03 – 2017-07-04 (×2): 750 mg via INTRAVENOUS
  Filled 2017-07-03 (×3): qty 150

## 2017-07-03 MED ORDER — SIMVASTATIN 20 MG PO TABS
20.0000 mg | ORAL_TABLET | Freq: Every day | ORAL | Status: DC
  Administered 2017-07-03 – 2017-07-07 (×5): 20 mg via ORAL
  Filled 2017-07-03 (×5): qty 1

## 2017-07-03 MED ORDER — INSULIN ASPART 100 UNIT/ML ~~LOC~~ SOLN
0.0000 [IU] | Freq: Three times a day (TID) | SUBCUTANEOUS | Status: DC
  Administered 2017-07-03 (×2): 1 [IU] via SUBCUTANEOUS
  Administered 2017-07-03: 2 [IU] via SUBCUTANEOUS
  Administered 2017-07-04: 3 [IU] via SUBCUTANEOUS
  Administered 2017-07-04: 2 [IU] via SUBCUTANEOUS
  Administered 2017-07-04: 5 [IU] via SUBCUTANEOUS
  Administered 2017-07-05 (×3): 3 [IU] via SUBCUTANEOUS
  Administered 2017-07-06: 7 [IU] via SUBCUTANEOUS
  Administered 2017-07-06 – 2017-07-07 (×3): 5 [IU] via SUBCUTANEOUS
  Administered 2017-07-07: 7 [IU] via SUBCUTANEOUS
  Filled 2017-07-03 (×17): qty 1

## 2017-07-03 MED ORDER — BISACODYL 5 MG PO TBEC: 5.0000 mg | DELAYED_RELEASE_TABLET | Freq: Every day | ORAL | Status: DC | PRN

## 2017-07-03 MED ORDER — DOCUSATE SODIUM 100 MG PO CAPS
100.0000 mg | ORAL_CAPSULE | Freq: Two times a day (BID) | ORAL | Status: DC
  Administered 2017-07-03 – 2017-07-05 (×3): 100 mg via ORAL
  Filled 2017-07-03 (×5): qty 1

## 2017-07-03 MED ORDER — SODIUM CHLORIDE 0.9 % IV SOLN
INTRAVENOUS | Status: AC
  Administered 2017-07-03: via INTRAVENOUS

## 2017-07-03 MED ORDER — SODIUM CHLORIDE 0.9 % IV SOLN: 4.0000 g | Freq: Once | INTRAVENOUS | Status: DC

## 2017-07-03 MED ORDER — SODIUM CHLORIDE 0.9 % IV SOLN: 2.0000 g | Freq: Once | INTRAVENOUS | Status: DC

## 2017-07-03 MED ORDER — MAGNESIUM SULFATE 4 GM/100ML IV SOLN
4.0000 g | Freq: Once | INTRAVENOUS | Status: AC
  Administered 2017-07-03: 4 g via INTRAVENOUS
  Filled 2017-07-03: qty 100

## 2017-07-03 MED ORDER — HEPARIN SODIUM (PORCINE) 5000 UNIT/ML IJ SOLN
5000.0000 [IU] | Freq: Three times a day (TID) | INTRAMUSCULAR | Status: DC
  Administered 2017-07-03 – 2017-07-07 (×14): 5000 [IU] via SUBCUTANEOUS
  Filled 2017-07-03 (×14): qty 1

## 2017-07-03 MED ORDER — ADULT MULTIVITAMIN W/MINERALS CH
1.0000 | ORAL_TABLET | Freq: Every day | ORAL | Status: DC
  Administered 2017-07-04 – 2017-07-07 (×3): 1 via ORAL
  Filled 2017-07-03 (×4): qty 1

## 2017-07-03 MED ORDER — ONDANSETRON HCL 4 MG/2ML IJ SOLN
4.0000 mg | Freq: Four times a day (QID) | INTRAMUSCULAR | Status: DC | PRN
  Administered 2017-07-03 – 2017-07-06 (×3): 4 mg via INTRAVENOUS
  Filled 2017-07-03 (×5): qty 2

## 2017-07-03 MED ORDER — ONDANSETRON HCL 4 MG PO TABS: 4.0000 mg | ORAL_TABLET | Freq: Four times a day (QID) | ORAL | Status: DC | PRN

## 2017-07-03 MED ORDER — SODIUM CHLORIDE 0.9 % IV SOLN: 1.0000 g | INTRAVENOUS | Status: DC

## 2017-07-03 MED ORDER — POTASSIUM CHLORIDE 10 MEQ/100ML IV SOLN
10.0000 meq | INTRAVENOUS | Status: AC
  Administered 2017-07-03 (×3): 10 meq via INTRAVENOUS
  Filled 2017-07-03 (×4): qty 100

## 2017-07-03 MED ORDER — SODIUM CHLORIDE 0.9 % IV SOLN
INTRAVENOUS | Status: DC
  Administered 2017-07-03: 1.1 [IU]/h via INTRAVENOUS
  Filled 2017-07-03: qty 1

## 2017-07-03 MED ORDER — PNEUMOCOCCAL VAC POLYVALENT 25 MCG/0.5ML IJ INJ: 0.5000 mL | INJECTION | INTRAMUSCULAR | Status: DC

## 2017-07-03 MED ORDER — ACETAMINOPHEN 325 MG PO TABS
650.0000 mg | ORAL_TABLET | Freq: Four times a day (QID) | ORAL | Status: DC | PRN
  Administered 2017-07-04: 650 mg via ORAL
  Filled 2017-07-03 (×2): qty 2

## 2017-07-03 MED ORDER — POTASSIUM CHLORIDE 20 MEQ PO PACK
20.0000 meq | PACK | Freq: Once | ORAL | Status: AC
  Administered 2017-07-03: 20 meq via ORAL
  Filled 2017-07-03: qty 1

## 2017-07-03 NOTE — Progress Notes (Signed)
[facility] [place] [facility]   PATIENT NAME: Rebecca Ball    MR#:  [id number]  DATE OF BIRTH:  Mar 26, 1941  SUBJECTIVE:   Patient presents with buttock abscess and elevated blood sugars  REVIEW OF SYSTEMS:    Review of Systems  Constitutional: Negative for fever, chills weight loss HENT: Negative for ear pain, nosebleeds, congestion, facial swelling, rhinorrhea, neck pain, neck stiffness and ear discharge.   Respiratory: Negative for cough, shortness of breath, wheezing  Cardiovascular: Negative for chest pain, palpitations and leg swelling.  Gastrointestinal: Negative for heartburn, abdominal pain, vomiting, diarrhea or consitpation Genitourinary: Negative for dysuria, urgency, frequency, hematuria Musculoskeletal: Negative for back pain or joint pain Neurological: Negative for dizziness, seizures, syncope, focal weakness,  numbness and headaches.  Hematological: Does not bruise/bleed easily.  Psychiatric/Behavioral: Negative for hallucinations, confusion, dysphoric mood  Skin: She has a buttock abscess that is draining  Tolerating Diet: yes      DRUG ALLERGIES:   Allergies  Allergen Reactions  . Penicillins Anaphylaxis  . Oxycodone Nausea And Vomiting  . Propoxyphene Nausea And Vomiting    VITALS:  Blood pressure (!) 121/52, pulse (!) 108, temperature 99.5 F (37.5 C), temperature source Oral, resp. rate 19, height 5\' 4"  (1.626 m), weight 94.2 kg (207 lb 10.8 oz), SpO2 94 %.  PHYSICAL EXAMINATION:  Constitutional: Appears well-developed and well-nourished. No distress. HENT: Normocephalic. Marland Kitchen Oropharynx is clear and moist.  Eyes: Conjunctivae and EOM are normal. PERRLA, no scleral icterus.  Neck: Normal ROM. Neck supple. No JVD. No tracheal deviation. CVS: RRR, S1/S2 +, no murmurs, no gallops, no carotid bruit.  Pulmonary: Effort and breath sounds normal, no stridor, rhonchi, wheezes, rales.  Abdominal: Soft. BS +,  no distension,  tenderness, rebound or guarding.  Musculoskeletal: Normal range of motion. No edema and no tenderness.  Neuro: Alert. CN 2-12 grossly intact. No focal deficits. Skin: right buttock abscess Psychiatric: Normal mood and affect.      LABORATORY PANEL:   CBC Recent Labs  Lab 07/03/17 0125  WBC 15.2*  HGB 10.2*  HCT 32.4*  PLT 256   ------------------------------------------------------------------------------------------------------------------  Chemistries  Recent Labs  Lab 07/03/17 0612  NA 135  K 3.0*  CL 103  CO2 20*  GLUCOSE 146*  BUN 62*  CREATININE 2.55*  CALCIUM 7.6*  MG 1.4*   ------------------------------------------------------------------------------------------------------------------  Cardiac Enzymes No results for input(s): TROPONINI in the last 168 hours. ------------------------------------------------------------------------------------------------------------------  RADIOLOGY:  US Pelvis Limited (transabdominal Only)  Result Date: 07/02/2017 CLINICAL DATA:  Initial evaluation for right buttock abscess for 1 week. EXAM: LIMITED ULTRASOUND OF PELVIS TECHNIQUE: Limited transabdominal ultrasound examination of the pelvis was performed. COMPARISON:  Prior CT from 08/16/2005. FINDINGS: Targeted ultrasound of in area of concern at the medial aspect of the right buttock was performed. Ultrasound demonstrates a complex hypoechoic area/collection measuring 1.1 x 1.0 x 1.1 cm. There appears to be a communicating sinus tract extending towards the skin. Collection is positioned approximately 4-5 mm deep to the skin. No other discrete fluid collections identified. Per technologist note, patient's entire right buttock is erythematous and swollen, suggesting cellulitis. Ill-defined soft tissue edema is seen throughout the remaining visualized areas of the right buttock. IMPRESSION: 1.1 x 1.0 x 1.1 cm complex hypoechoic collection positioned within the medial aspect of the  right buttock, approximately 4-5 mm deep to the skin, with associated linear tract extending towards the skin superficially. Finding most consistent with a small abscess with draining sinus tract. Surrounding soft tissue  swelling edema suggestive of associated cellulitis. Electronically Signed   By: Rise MuBenjamin  McClintock M.D.   On: 07/02/2017 20:55     ASSESSMENT AND PLAN:   77 year old female with a history of diabetes who presents due to right buttock abscess.  1. Sepsis: Patient presents with fever, tachycardia and elevated white blood cell count. Sepsis is due to right buttock abscess.   2. Right buttock abscess:Continue vancomycin and Zosyn Surgery consultation requested for incision and drainage possibly ID consultation requested as well Follow   Calcitonin level  3. Acute kidney injury in the setting of poor by mouth intake and ATN with sepsis Creatinine improving with IV fluids Nephrology consultation requested Hold nephrotoxic medications  4. Electrolyte abnormalities including low potassium and magnesium: Replace and recheck in a.m.  5.  DKA: This is resolved Continue sliding scale with long-acting insulin and ADA diet Diabetes nurse consultation requested  6. Essential hypertension: Continue metoprolol    Management plans discussed with the patient and she is in agreement.  CODE STATUS: full  TOTAL TIME TAKING CARE OF THIS PATIENT: 30 minutes.     POSSIBLE D/C 2 days, DEPENDING ON CLINICAL CONDITION.   Dyami Umbach M.D on 07/03/2017 at 8:54 AM  Between 7am to 6pm - Pager - [phone] After 6pm go to [email or web address] - password [email or web address]  [facility] [place] [facility]  Office  [phone]  CC: Primary care physician; Leotis ShamesSingh, Jasmine, MD  Note: This dictation was prepared with Dragon dictation along with smaller phrase technology. Any transcriptional errors that result from this process are unintentional.

## 2017-07-03 NOTE — Progress Notes (Signed)
Initial Nutrition Assessment  DOCUMENTATION CODES:   Obesity unspecified  INTERVENTION:  Provide Premier Protein po BID, each supplement provides 160 kcal and 30 grams of protein.  Provide daily MVI.  Encouraged adequate intake of protein at meals. Discussed protein options patient may tolerate better with her nausea. Also discussed sipping on cool beverages and having some dry crackers to help with nausea.  NUTRITION DIAGNOSIS:   Inadequate oral intake related to decreased appetite, nausea, vomiting as evidenced by per patient/family report.  GOAL:   Patient will meet greater than or equal to 90% of their needs  MONITOR:   PO intake, Supplement acceptance, Labs, Weight trends, Skin, I & O's  REASON FOR ASSESSMENT:   Malnutrition Screening Tool    ASSESSMENT:   77 year old female with PMHx of DM type 2 and HTN who is now admitted with sepsis from right buttock abscess, AKI, and DKA.   -Patient was assessed by general surgery this AM. Since abscess is spontaneously draining she may not require surgical intervention.  Met with patient and her husband at bedside. Patient reports she has been experiencing N/V for the past week and has had difficulty keeping food and beverages down. She has not been eating solid foods at home this past week and has been having a low-calorie Sunny-D to drink. She reports having emesis about 15 minutes after eating or drinking anything. She is feeling better with the Zofran here but reports it does not always last very long. She was able to eat some solid food for breakfast this morning but reports she had emesis afterwards. For lunch today she is going to order a fruit and cottage cheese plate. She is also amenable to drinking Premier Protein to help meet protein needs until her appetite returns.  Patient reports she feels like she has lost 6-8 lbs this week. However, when RD asked what her UBW is before the weight loss she reports she is unsure. She  just felt like when she saw the weight it was about 6-8 lbs lower. Per chart she was 215.4 lbs on 03/14/2017. Per her report she has lost approximately 3.3% body weight over one week, which is significant for time frame, but as patient was dehydrated weight may increase some with rehydration.  Medications reviewed and include: Colace, Novolog 0-9 units TID, Novolog 0-5 units QHS, Lantus 10 units daily, NS @ 125 mL/hr, regular insulin gtt, Zosyn, vancomycin.  Labs reviewed: CBG 126-182, Potassium 3, CO2 20, BUN 62 (trending down), Creatinine 2.55 (trending down), HgbA1c 6.8.  Patient does not meet criteria for malnutrition.  Discussed with RN.  NUTRITION - FOCUSED PHYSICAL EXAM:    Most Recent Value  Orbital Region  No depletion  Upper Arm Region  No depletion  Thoracic and Lumbar Region  No depletion  Buccal Region  No depletion  Temple Region  No depletion  Clavicle Bone Region  No depletion  Clavicle and Acromion Bone Region  No depletion  Scapular Bone Region  No depletion  Dorsal Hand  No depletion  Patellar Region  No depletion  Anterior Thigh Region  No depletion  Posterior Calf Region  No depletion  Edema (RD Assessment)  None  Hair  Reviewed  Eyes  Reviewed  Mouth  Reviewed  Skin  Reviewed  Nails  Reviewed     Diet Order:  Diet Carb Modified Fluid consistency: Thin; Room service appropriate? Yes  EDUCATION NEEDS:   Education needs have been addressed  Skin:  Skin Assessment: Skin  Integrity Issues:(cellulitis/abscess to right buttocks)  Last BM:  07/03/2017 - large type 6  Height:   Ht Readings from Last 1 Encounters:  07/03/17 5' 4"  (1.626 m)    Weight:   Wt Readings from Last 1 Encounters:  07/03/17 207 lb 10.8 oz (94.2 kg)    Ideal Body Weight:  54.5 kg  BMI:  Body mass index is 35.65 kg/m.  Estimated Nutritional Needs:   Kcal:  1705-1990 (MSJ x 1.2-1.4)  Protein:  85-100 grams (0.9-1.1 grams/kg)  Fluid:  1.7-2 L/day (1 mL/kcal)  Willey Blade, MS, RD, LDN Office: [phone] Pager: [phone] After Hours/Weekend Pager: [phone]

## 2017-07-03 NOTE — Consult Note (Signed)
[facility] Infectious Disease     Reason for Consult:Buttock abscess   Referring Physician:Bettey Costa  Date of Admission:  07/02/2017   Active Problems:   Cellulitis and abscess of buttock   DKA (diabetic ketoacidoses) (HCC)   Acute renal failure (HCC)   HPI: Rebecca Ball is a 77 y.o. female admitted with DKA in setting of worsening pain on R buttocks where she was found to have a self draining abscess. On admit hwe wbc was 20 and temp up to 100.6. She had USS showing small abscess with sinus tract to skin.  Gram stain is mixed GNR, GPC and GPR.  [place] neg. MRSA PCR neg.     Past Medical History:  Diagnosis Date  . Diabetes mellitus without complication ([place])   . Hypertension    History reviewed. No pertinent surgical history. Social History   Tobacco Use  . Smoking status: Never Smoker  . Smokeless tobacco: Never Used  Substance Use Topics  . Alcohol use: Not on file  . Drug use: Not on file   Family History  Problem Relation Age of Onset  . Breast cancer Paternal Aunt     Allergies:  Allergies  Allergen Reactions  . Penicillins Anaphylaxis  . Oxycodone Nausea And Vomiting  . Propoxyphene Nausea And Vomiting    Current antibiotics: Antibiotics Given (last 72 hours)    Date/Time Action Medication Dose Rate   07/02/17 2055 New Bag/Given   vancomycin (VANCOCIN) IVPB 1000 mg/200 mL premix 1,000 mg 200 mL/hr   07/03/17 0203 New Bag/Given   piperacillin-tazobactam (ZOSYN) IVPB 3.375 g 3.375 g 12.5 mL/hr   07/03/17 1334 New Bag/Given   piperacillin-tazobactam (ZOSYN) IVPB 3.375 g 3.375 g 12.5 mL/hr      MEDICATIONS: . aspirin  81 mg Oral Daily  . docusate sodium  100 mg Oral BID  . heparin  5,000 Units Subcutaneous Q8H  . insulin aspart  0-5 Units Subcutaneous QHS  . insulin aspart  0-9 Units Subcutaneous TID WC  . insulin glargine  10 Units Subcutaneous Daily  . metoprolol succinate  25 mg Oral Daily  . [START ON 07/04/2017] multivitamin with minerals   1 tablet Oral Daily  . [START ON 07/04/2017] pneumococcal 23 valent vaccine  0.5 mL Intramuscular Tomorrow-1000  . protein supplement shake  11 oz Oral BID BM  . simvastatin  20 mg Oral Daily    Review of Systems - 11 systems reviewed and negative per HPI   OBJECTIVE: Temp:  [98.9 F (37.2 C)-100.6 F (38.1 C)] 98.9 F (37.2 C) (03/19 1300) Pulse Rate:  [65-108] 100 (03/19 1300) Resp:  [17-28] 26 (03/19 1300) BP: (98-135)/(48-61) 111/58 (03/19 1300) SpO2:  [91 %-98 %] 94 % (03/19 1300) Weight:  [93.4 kg (206 lb)-94.2 kg (207 lb 10.8 oz)] 94.2 kg (207 lb 10.8 oz) (03/19 0112) Physical Exam  Constitutional:  oriented to person, place, and time. appears well-developed and well-nourished. No distress.  HENT: [place]/AT, PERRLA, no scleral icterus Mouth/Throat: Oropharynx is clear and moist. No oropharyngeal exudate.  Cardiovascular: Normal rate, regular rhythm and normal heart sounds. Exam reveals no gallop and no friction rub.  No murmur heard.  Pulmonary/Chest: Effort normal and breath sounds normal. No respiratory distress.  has no wheezes.  Neck = supple, no nuchal rigidity Abdominal: Soft. Bowel sounds are normal.  exhibits no distension. There is no tenderness.  Lymphadenopathy: no cervical adenopathy. No axillary adenopathy Neurological: alert and oriented to person, place, and time.  Skin: draining gluteal abscess on  the patient's right side with marked induration and drainage of  foul-smelling fluid  Psychiatric: a normal mood and affect.  behavior is normal.    LABS: Results for orders placed or performed during the hospital encounter of 07/02/17 (from the past 48 hour(s))  Basic metabolic panel     Status: Abnormal   Collection Time: 07/02/17  6:13 PM  Result Value Ref Range   Sodium 128 (L) 135 - 145 mmol/L   Potassium 2.8 (L) 3.5 - 5.1 mmol/L   Chloride 91 (L) 101 - 111 mmol/L   CO2 21 (L) 22 - 32 mmol/L   Glucose, Bld 269 (H) 65 - 99 mg/dL   BUN 65 (H) 6 - 20 mg/dL    Creatinine, Ser 3.26 (H) 0.44 - 1.00 mg/dL   Calcium 8.2 (L) 8.9 - 10.3 mg/dL   GFR calc non Af Amer 13 (L) >60 mL/min   GFR calc Af Amer 15 (L) >60 mL/min    Comment: (NOTE) The eGFR has been calculated using the CKD EPI equation. This calculation has not been validated in all clinical situations. eGFR's persistently <60 mL/min signify possible Chronic Kidney Disease.    Anion gap 16 (H) 5 - 15    Comment: Performed at [facility], [address] [place], [place] [address]  CBC with Differential     Status: Abnormal   Collection Time: 07/02/17  6:13 PM  Result Value Ref Range   WBC 20.1 (H) 3.6 - 11.0 K/uL   RBC 4.93 3.80 - 5.20 MIL/uL   Hemoglobin 11.2 (L) 12.0 - 16.0 g/dL   HCT 35.6 35.0 - 47.0 %   MCV 72.2 (L) 80.0 - 100.0 fL   MCH 22.7 (L) 26.0 - 34.0 pg   MCHC 31.5 (L) 32.0 - 36.0 g/dL   RDW 17.0 (H) 11.5 - 14.5 %   Platelets 302 150 - 440 K/uL   Neutrophils Relative % 93 %   Neutro Abs 18.7 (H) 1.4 - 6.5 K/uL   Lymphocytes Relative 3 %   Lymphs Abs 0.5 (L) 1.0 - 3.6 K/uL   Monocytes Relative 4 %   Monocytes Absolute 0.8 0.2 - 0.9 K/uL   Eosinophils Relative 0 %   Eosinophils Absolute 0.0 0 - 0.7 K/uL   Basophils Relative 0 %   Basophils Absolute 0.0 0 - 0.1 K/uL    Comment: Performed at [facility], [address] [place], [place] [address]  Culture, blood (routine x 2)     Status: None (Preliminary result)   Collection Time: 07/02/17  6:13 PM  Result Value Ref Range   Specimen Description BLOOD LEFT ASSIST CONTROL    Special Requests      BOTTLES DRAWN AEROBIC AND ANAEROBIC Blood Culture adequate volume   Culture      NO GROWTH < 12 HOURS Performed at [facility], [address] [place], [place] [address]    Report Status PENDING   Culture, blood (routine x 2)     Status: None (Preliminary result)   Collection Time: 07/02/17  6:13 PM  Result Value Ref Range   Specimen Description BLOOD RIGHT ASSIST CONTROL    Special  Requests      BLOOD Blood Culture results may not be optimal due to an excessive volume of blood received in culture bottles   Culture      NO GROWTH < 12 HOURS Performed at [facility], [address], [place], [place] [address]    Report Status  PENDING   Wound or Superficial Culture     Status: None (Preliminary result)   Collection Time: 07/02/17  6:13 PM  Result Value Ref Range   Specimen Description      BUTTOCKS Performed at [facility], [address] [place] [address], [place], [place] [address]    Special Requests      Normal Performed at [facility] [place] [facility], [place], [place] [address]    Gram Stain      NO WBC SEEN NO SQUAMOUS EPITHELIAL CELLS SEEN ABUNDANT GRAM NEGATIVE RODS MODERATE GRAM POSITIVE COCCI IN CLUSTERS MODERATE GRAM POSITIVE RODS    Culture      CULTURE REINCUBATED FOR BETTER GROWTH Performed at [facility], [place] [address] [place], [place] [address]    Report Status PENDING   Lactic acid, plasma     Status: Abnormal   Collection Time: 07/02/17  7:09 PM  Result Value Ref Range   Lactic Acid, Venous 2.6 (HH) 0.5 - 1.9 mmol/L    Comment: CRITICAL RESULT CALLED TO, READ BACK BY AND VERIFIED WITH Orlinda RIVERA AT 1945 ON 07/02/2017 JJB Performed at [facility], [address] [place], [place] [address]   Glucose, capillary     Status: Abnormal   Collection Time: 07/03/17 12:18 AM  Result Value Ref Range   Glucose-Capillary 182 (H) 65 - 99 mg/dL  Glucose, capillary     Status: Abnormal   Collection Time: 07/03/17  1:04 AM  Result Value Ref Range   Glucose-Capillary 172 (H) 65 - 99 mg/dL  MRSA PCR Screening     Status: None   Collection Time: 07/03/17  1:05 AM  Result Value Ref Range   MRSA by PCR NEGATIVE NEGATIVE    Comment:        The GeneXpert MRSA Assay (FDA approved for NASAL specimens only), is one component of a comprehensive MRSA colonization surveillance program. It is not intended to diagnose  MRSA infection nor to guide or monitor treatment for MRSA infections. Performed at [facility], [address] [place], [place] [address]   Lactic acid, plasma     Status: None   Collection Time: 07/03/17  1:25 AM  Result Value Ref Range   Lactic Acid, Venous 1.7 0.5 - 1.9 mmol/L    Comment: Performed at [facility], [address] [place], [place] [address]  Basic metabolic panel     Status: Abnormal   Collection Time: 07/03/17  1:25 AM  Result Value Ref Range   Sodium 130 (L) 135 - 145 mmol/L   Potassium 2.7 (LL) 3.5 - 5.1 mmol/L    Comment: CRITICAL RESULT CALLED TO, READ BACK BY AND VERIFIED WITH JOSH [employer] RN AT 0205 07/03/17. MSS    Chloride 97 (L) 101 - 111 mmol/L   CO2 19 (L) 22 - 32 mmol/L   Glucose, Bld 174 (H) 65 - 99 mg/dL   BUN 64 (H) 6 - 20 mg/dL   Creatinine, Ser 2.76 (H) 0.44 - 1.00 mg/dL   Calcium 7.4 (L) 8.9 - 10.3 mg/dL   GFR calc non Af Amer 16 (L) >60 mL/min   GFR calc Af Amer 18 (L) >60 mL/min    Comment: (NOTE) The eGFR has been calculated using the CKD EPI equation. This calculation has not been validated in all clinical situations. eGFR's persistently <60 mL/min signify possible Chronic Kidney Disease.    Anion gap 14 5 - 15    Comment: Performed at [facility], [address] [place], [place] [address]  CBC     Status: Abnormal   Collection Time: 07/03/17  1:25 AM  Result Value Ref Range   WBC 15.2 (H) 3.6 - 11.0 K/uL   RBC 4.47 3.80 - 5.20 MIL/uL   Hemoglobin 10.2 (L) 12.0 - 16.0 g/dL   HCT 32.4 (L) 35.0 - 47.0 %   MCV 72.4 (L) 80.0 - 100.0 fL   MCH 22.8 (L) 26.0 - 34.0 pg   MCHC 31.5 (L) 32.0 - 36.0 g/dL   RDW 16.8 (H) 11.5 - 14.5 %   Platelets 256 150 - 440 K/uL    Comment: Performed at [facility], [address] [place], [place] [address]  Procalcitonin - Baseline     Status: None   Collection Time: 07/03/17  1:25 AM  Result Value Ref Range   Procalcitonin 4.48 ng/mL    Comment:         Interpretation: PCT > 2 ng/mL: Systemic infection (sepsis) is likely, unless other causes are known. (NOTE)       Sepsis PCT Algorithm           Lower Respiratory Tract                                      Infection PCT Algorithm    ----------------------------     ----------------------------         PCT < 0.25 ng/mL                PCT < 0.10 ng/mL         Strongly encourage             Strongly discourage   discontinuation of antibiotics    initiation of antibiotics    ----------------------------     -----------------------------       PCT 0.25 - 0.50 ng/mL            PCT 0.10 - 0.25 ng/mL               OR       >80% decrease in PCT            Discourage initiation of                                            antibiotics      Encourage discontinuation           of antibiotics    ----------------------------     -----------------------------         PCT >= 0.50 ng/mL              PCT 0.26 - 0.50 ng/mL               AND       <80% decrease in PCT              Encourage initiation of                                             antibiotics       Encourage continuation           of antibiotics    ----------------------------     -----------------------------  PCT >= 0.50 ng/mL                  PCT > 0.50 ng/mL               AND         increase in PCT                  Strongly encourage                                      initiation of antibiotics    Strongly encourage escalation           of antibiotics                                     -----------------------------                                           PCT <= 0.25 ng/mL                                                 OR                                        > 80% decrease in PCT                                     Discontinue / Do not initiate                                             antibiotics Performed at [facility], [address] [place], [place] [address]   Hemoglobin A1c     Status:  Abnormal   Collection Time: 07/03/17  1:25 AM  Result Value Ref Range   Hgb A1c MFr Bld 6.8 (H) 4.8 - 5.6 %    Comment: (NOTE) Pre diabetes:          5.7%-6.4% Diabetes:              >6.4% Glycemic control for   <7.0% adults with diabetes    Mean Plasma Glucose 148.46 mg/dL    Comment: Performed at [facility], [place] [address], [place], [place] [address]  Glucose, capillary     Status: Abnormal   Collection Time: 07/03/17  1:58 AM  Result Value Ref Range   Glucose-Capillary 144 (H) 65 - 99 mg/dL  Glucose, capillary     Status: Abnormal   Collection Time: 07/03/17  3:03 AM  Result Value Ref Range   Glucose-Capillary 156 (H) 65 - 99 mg/dL  Glucose, capillary     Status: Abnormal   Collection Time: 07/03/17  4:09 AM  Result Value Ref Range   Glucose-Capillary 162 (H) 65 - 99 mg/dL  Glucose, capillary  Status: Abnormal   Collection Time: 07/03/17  4:56 AM  Result Value Ref Range   Glucose-Capillary 152 (H) 65 - 99 mg/dL  Magnesium     Status: Abnormal   Collection Time: 07/03/17  6:12 AM  Result Value Ref Range   Magnesium 1.4 (L) 1.7 - 2.4 mg/dL    Comment: Performed at [facility], [address] [place], [place] [address]  Basic metabolic panel     Status: Abnormal   Collection Time: 07/03/17  6:12 AM  Result Value Ref Range   Sodium 135 135 - 145 mmol/L   Potassium 3.0 (L) 3.5 - 5.1 mmol/L   Chloride 103 101 - 111 mmol/L   CO2 20 (L) 22 - 32 mmol/L   Glucose, Bld 146 (H) 65 - 99 mg/dL   BUN 62 (H) 6 - 20 mg/dL   Creatinine, Ser 2.55 (H) 0.44 - 1.00 mg/dL   Calcium 7.6 (L) 8.9 - 10.3 mg/dL   GFR calc non Af Amer 17 (L) >60 mL/min   GFR calc Af Amer 20 (L) >60 mL/min    Comment: (NOTE) The eGFR has been calculated using the CKD EPI equation. This calculation has not been validated in all clinical situations. eGFR's persistently <60 mL/min signify possible Chronic Kidney Disease.    Anion gap 12 5 - 15    Comment: Performed at [facility], [address] [place], [place] [address]  Glucose, capillary     Status: Abnormal   Collection Time: 07/03/17  6:20 AM  Result Value Ref Range   Glucose-Capillary 136 (H) 65 - 99 mg/dL  Glucose, capillary     Status: Abnormal   Collection Time: 07/03/17  7:34 AM  Result Value Ref Range   Glucose-Capillary 126 (H) 65 - 99 mg/dL  Glucose, capillary     Status: Abnormal   Collection Time: 07/03/17 12:10 PM  Result Value Ref Range   Glucose-Capillary 137 (H) 65 - 99 mg/dL   No components found for: ESR, C REACTIVE PROTEIN MICRO: Recent Results (from the past 720 hour(s))  Culture, blood (routine x 2)     Status: None (Preliminary result)   Collection Time: 07/02/17  6:13 PM  Result Value Ref Range Status   Specimen Description BLOOD LEFT ASSIST CONTROL  Final   Special Requests   Final    BOTTLES DRAWN AEROBIC AND ANAEROBIC Blood Culture adequate volume   Culture   Final    NO GROWTH < 12 HOURS Performed at [facility], [address] [place], [place] [address]    Report Status PENDING  Incomplete  Culture, blood (routine x 2)     Status: None (Preliminary result)   Collection Time: 07/02/17  6:13 PM  Result Value Ref Range Status   Specimen Description BLOOD RIGHT ASSIST CONTROL  Final   Special Requests   Final    BLOOD Blood Culture results may not be optimal due to an excessive volume of blood received in culture bottles   Culture   Final    NO GROWTH < 12 HOURS Performed at [facility], [address] [place], [place] [address]    Report Status PENDING  Incomplete  Wound or Superficial Culture     Status: None (Preliminary result)   Collection Time: 07/02/17  6:13 PM  Result Value Ref Range Status   Specimen Description   Final    BUTTOCKS Performed at [facility], [address] [place], [place] [address]  Special Requests   Final    Normal Performed at [place], [place] [address]     Gram Stain   Final    NO WBC SEEN NO SQUAMOUS EPITHELIAL CELLS SEEN ABUNDANT GRAM NEGATIVE RODS MODERATE GRAM POSITIVE COCCI IN CLUSTERS MODERATE GRAM POSITIVE RODS    Culture   Final    CULTURE REINCUBATED FOR BETTER GROWTH Performed at [facility], [place] [address], [place], [place] [address]    Report Status PENDING  Incomplete  MRSA PCR Screening     Status: None   Collection Time: 07/03/17  1:05 AM  Result Value Ref Range Status   MRSA by PCR NEGATIVE NEGATIVE Final    Comment:        The GeneXpert MRSA Assay (FDA approved for NASAL specimens only), is one component of a comprehensive MRSA colonization surveillance program. It is not intended to diagnose MRSA infection nor to guide or monitor treatment for MRSA infections. Performed at [facility], [address] [place], [place] [address]     IMAGING: US Pelvis Limited (transabdominal Only)  Result Date: 07/02/2017 CLINICAL DATA:  Initial evaluation for right buttock abscess for 1 week. EXAM: LIMITED ULTRASOUND OF PELVIS TECHNIQUE: Limited transabdominal ultrasound examination of the pelvis was performed. COMPARISON:  Prior CT from 08/16/2005. FINDINGS: Targeted ultrasound of in area of concern at the medial aspect of the right buttock was performed. Ultrasound demonstrates a complex hypoechoic area/collection measuring 1.1 x 1.0 x 1.1 cm. There appears to be a communicating sinus tract extending towards the skin. Collection is positioned approximately 4-5 mm deep to the skin. No other discrete fluid collections identified. Per technologist note, patient's entire right buttock is erythematous and swollen, suggesting cellulitis. Ill-defined soft tissue edema is seen throughout the remaining visualized areas of the right buttock. IMPRESSION: 1.1 x 1.0 x 1.1 cm complex hypoechoic collection positioned within the medial aspect of the right buttock, approximately 4-5 mm deep to the skin, with associated linear  tract extending towards the skin superficially. Finding most consistent with a small abscess with draining sinus tract. Surrounding soft tissue swelling edema suggestive of associated cellulitis. Electronically Signed   By: Jeannine Boga M.D.   On: 07/02/2017 20:55    Assessment:   KOLBEE STALLMAN is a 77 y.o. female with DM (A1c 7.5 in Nov 2018) admitted with R buttock abscess now self draining and DKA.  Gram stain is mixed. On vanco and zosyn. Clinically improving.   Recommendations Cont vanco and zosyn Monitor for need for debridement.  Thank you very much for allowing me to participate in the care of this patient. Please call with questions.   Cheral Marker. Ola Spurr, MD

## 2017-07-03 NOTE — Progress Notes (Signed)
Inpatient Diabetes Program Recommendations  AACE/ADA: New Consensus Statement on Inpatient Glycemic Control (2015)  Target Ranges:  Prepandial:   less than 140 mg/dL      Peak postprandial:   less than 180 mg/dL (1-2 hours)      Critically ill patients:  140 - 180 mg/dL   Lab Results  Component Value Date   GLUCAP 137 (H) 07/03/2017   HGBA1C 6.8 (H) 07/03/2017    Review of Glycemic ControlResults for Robyne PeersWALKER, Shima R (MRN [id number]) as of 07/03/2017 12:48  Ref. Range 07/03/2017 04:09 07/03/2017 04:56 07/03/2017 06:20 07/03/2017 07:34 07/03/2017 12:10  Glucose-Capillary Latest Ref Range: 65 - 99 mg/dL 829162 (H) 562152 (H) 130136 (H) 126 (H) 137 (H)   Diabetes history: Type 2 DM Outpatient Diabetes medications: Metformin 1000 mg bid, Novolog tid with meals Current orders for Inpatient glycemic control:  Novolog sensitive tid with meals and HS, Lantus 10 units daily Inpatient Diabetes Program Recommendations:   A1C indicates well controlled DM prior to admit.  Note that blood sugars increased on admit with infection. Blood sugars now improved?  I doubt that patient will need insulin at d/c based on A1C.   Thanks,  Beryl MeagerJenny Micaylah Bertucci, RN, BC-ADM Inpatient Diabetes Coordinator Pager [phone] (8a-5p)

## 2017-07-03 NOTE — ED Notes (Signed)
Call to pharmacy for meds that are due.  They state that they will send to ICU

## 2017-07-03 NOTE — Progress Notes (Signed)
eLink Physician-Brief Progress Note Patient Name: Rebecca PeersJustina R Ball DOB: 19-Mar-1941 MRN: [id number]   Date of Service  07/03/2017  HPI/Events of Note  77 yo female admitted with buttock abscess, sepsis and DKA. Now on an insulin IV infusion. PCCM asked to assume care in ICU. VSS.   eICU Interventions  No new orders.     Intervention Category Evaluation Type: New Patient Evaluation  Lenell AntuSommer,Steven Eugene 07/03/2017, 1:02 AM

## 2017-07-03 NOTE — Progress Notes (Signed)
Pharmacy Electrolyte Monitoring Consult:  Pharmacy consulted to assist in monitoring and replacing electrolytes in this 77 y.o. female admitted on 07/02/2017 with Abscess   Labs:  Sodium (mmol/L)  Date Value  07/03/2017 135   Potassium (mmol/L)  Date Value  07/03/2017 3.0 (L)   Magnesium (mg/dL)  Date Value  86/57/846903/19/2019 1.4 (L)   Calcium (mg/dL)  Date Value  62/95/284103/19/2019 7.6 (L)    Plan: K 3.0; Patient received KCl 10mEq IV x3 runs and KCl 20mEq PO once  Mg 1.4; Patient received Mg 4g IV once  Pharmacy to follow electrolytes and continue to replace per protocol.   Cleopatra CedarStephanie Avantae Bither, PharmD Pharmacy Resident  07/03/2017 8:22 AM

## 2017-07-03 NOTE — Consult Note (Signed)
Patient ID: Rebecca Ball, female   DOB: Dec 05, 1940, 77 y.o.   MRN: [id number]  CC: Gluteal abscess  HPI Rebecca Ball is a 77 y.o. female who is currently admitted to the medicine service in the ICU for DKA.  General surgery consult requested by Dr.Maier for evaluation of her right buttocks abscess.  Patient reports that is been there for approximately 5 days.  She did not seek medical attention because of its location on her buttocks.  She states she developed nausea and vomiting at home and was unable to keep down her diabetic medications.  Patient reports that the area spontaneously draining.  She states that she is feeling better today than she has in days.  She has been tolerating a diet and denies any current fevers, chills, nausea, vomiting, chest pain, shortness of breath, diarrhea, constipation.  Patient has had breakfast already this morning.  HPI  Past Medical History:  Diagnosis Date  . Diabetes mellitus without complication (HCC)   . Hypertension     History reviewed. No pertinent surgical history.  Family History  Problem Relation Age of Onset  . Breast cancer Paternal Aunt     Social History Social History   Tobacco Use  . Smoking status: Never Smoker  . Smokeless tobacco: Never Used  Substance Use Topics  . Alcohol use: Not on file  . Drug use: Not on file    Allergies  Allergen Reactions  . Penicillins Anaphylaxis  . Oxycodone Nausea And Vomiting  . Propoxyphene Nausea And Vomiting    Current Facility-Administered Medications  Medication Dose Route Frequency Provider Last Rate Last Dose  . 0.9 %  sodium chloride infusion   Intravenous Continuous Cammy Copa, MD 150 mL/hr at 07/03/17 0816    . acetaminophen (TYLENOL) tablet 650 mg  650 mg Oral Q6H PRN Cammy Copa, MD       Or  . acetaminophen (TYLENOL) suppository 650 mg  650 mg Rectal Q6H PRN Cammy Copa, MD      . aspirin chewable tablet 81 mg  81 mg Oral Daily Cammy Copa, MD      .  bisacodyl (DULCOLAX) EC tablet 5 mg  5 mg Oral Daily PRN Cammy Copa, MD      . dextrose 5 %-0.45 % sodium chloride infusion   Intravenous Continuous Cammy Copa, MD   Stopped at 07/03/17 [phone]  . docusate sodium (COLACE) capsule 100 mg  100 mg Oral BID Cammy Copa, MD      . heparin injection 5,000 Units  5,000 Units Subcutaneous Q8H Cammy Copa, MD   5,000 Units at 07/03/17 [phone]  . HYDROcodone-acetaminophen (NORCO/VICODIN) 5-325 MG per tablet 1-2 tablet  1-2 tablet Oral Q4H PRN Cammy Copa, MD      . insulin aspart (novoLOG) injection 0-5 Units  0-5 Units Subcutaneous QHS Eugenie Norrie, NP      . insulin aspart (novoLOG) injection 0-9 Units  0-9 Units Subcutaneous TID WC Eugenie Norrie, NP   1 Units at 07/03/17 0758  . insulin glargine (LANTUS) injection 10 Units  10 Units Subcutaneous Daily Eugenie Norrie, NP   10 Units at 07/03/17 [phone]  . insulin regular (NOVOLIN R,HUMULIN R) 100 Units in sodium chloride 0.9 % 100 mL (1 Units/mL) infusion   Intravenous Continuous Cammy Copa, MD   Stopped at 07/03/17 [phone]  . magnesium sulfate IVPB 4 g 100 mL  4 g Intravenous Once Eugenie Norrie, NP 50 mL/hr at 07/03/17 0810 4 g at 07/03/17  2440  . metoprolol succinate (TOPROL-XL) 24 hr tablet 25 mg  25 mg Oral Daily Cammy Copa, MD      . ondansetron [facility]) tablet 4 mg  4 mg Oral Q6H PRN Cammy Copa, MD       Or  . ondansetron [facility]) injection 4 mg  4 mg Intravenous Q6H PRN Cammy Copa, MD   4 mg at 07/03/17 0046  . piperacillin-tazobactam (ZOSYN) IVPB 3.375 g  3.375 g Intravenous Q12H Cammy Copa, MD   Stopped at 07/03/17 [phone]  . [START ON 07/04/2017] pneumococcal 23 valent vaccine (PNU-IMMUNE) injection 0.5 mL  0.5 mL Intramuscular Tomorrow-1000 Ball, Rebecca A, RN      . simvastatin (ZOCOR) tablet 20 mg  20 mg Oral Daily Cammy Copa, MD      . traZODone (DESYREL) tablet 25 mg  25 mg Oral QHS PRN Cammy Copa, MD      . vancomycin (VANCOCIN) IVPB 750 mg/150 ml premix  750 mg  Intravenous Q36H Cammy Copa, MD         Review of Systems A multi-point review of systems was asked and was negative except for the findings documented in the HPI  Physical Exam Blood pressure (!) 121/52, pulse (!) 108, temperature 99.5 F (37.5 C), temperature source Oral, resp. rate 19, height 5\' 4"  (1.626 m), weight 94.2 kg (207 lb 10.8 oz), SpO2 94 %. CONSTITUTIONAL: Resting in bed no acute distress. EYES: Pupils are equal, round, and reactive to light, Sclera are non-icteric. EARS, NOSE, MOUTH AND THROAT: The oropharynx is clear. The oral mucosa is pink and moist. Hearing is intact to voice. LYMPH NODES:  Lymph nodes in the neck are normal. RESPIRATORY:  Lungs are clear. There is normal respiratory effort, with equal breath sounds bilaterally, and without pathologic use of accessory muscles. CARDIOVASCULAR: Heart is tachycardic without murmurs, gallops, or rubs. GI: The abdomen is soft, nontender, and nondistended. There are no palpable masses. There is no hepatosplenomegaly. There are normal bowel sounds in all quadrants. GU: Rectal exam performed shows a spontaneously draining gluteal abscess on the patient's right side.  It is draining a foul-smelling fluid with surrounding evidence of infection.   MUSCULOSKELETAL: Normal muscle strength and tone. No cyanosis or edema.   SKIN: Turgor is good and there are no pathologic skin lesions or ulcers. NEUROLOGIC: Motor and sensation is grossly normal. Cranial nerves are grossly intact. PSYCH:  Oriented to person, place and time. Affect is normal.  Data Reviewed Images and labs reviewed.  No CBC today, white blood cell count was 15.2 on admission overnight.  Electrolytes from this morning show a hypokalemia 3.0, elevated glucose of 146, elevated creatinine of 2.55, estimated GFR of 17. I have personally reviewed the patient's imaging, laboratory findings and medical records.    Assessment    Gluteal abscess    Plan    77 year old  female with a spontaneously draining gluteal abscess.  Discussed with the patient that as long as the area improved with the spontaneous drainage in association with the antibiotics then she may not require surgical intervention.  However should she fail to completely resolve the abscess spontaneously it may require drainage.  She voiced understanding.  Would recommend continuing antibiotic therapy.  Would recommend providing local wound care with ABD pads to keep her from saturating her bedding and clothing.  Warm compresses could be used to further help with the drainage.  General surgery will continue to follow with you.     Time spent with the  patient was 55 minutes, with more than 50% of the time spent in face-to-face education, counseling and care coordination.     Ricarda Frameharles Raney Koeppen, MD FACS General Surgeon 07/03/2017, 9:03 AM

## 2017-07-03 NOTE — Progress Notes (Signed)
Pharmacy Antibiotic Note  Rebecca Ball is a 77 y.o. female admitted on 07/02/2017 with cellulitis with abscess.  Pharmacy has been consulted for vancomycin and Zosyn dosing.  Plan: With improvement in SCr, will increase vancomycin to 750 mg iv q 24 hours.   Zosyn 3.375 grams q 12 hours ordered.  Will continue to follow renal function closely and check levels/adjust dosing as clinaclly indicated.   Height: 5\' 4"  (162.6 cm) Weight: 207 lb 10.8 oz (94.2 kg) IBW/kg (Calculated) : 54.7  Temp (24hrs), Avg:99.6 F (37.6 C), Min:98.9 F (37.2 C), Max:100.6 F (38.1 C)  Recent Labs  Lab 07/02/17 1813 07/02/17 1909 07/03/17 0125 07/03/17 0612  WBC 20.1*  --  15.2*  --   CREATININE 3.26*  --  2.76* 2.55*  LATICACIDVEN  --  2.6* 1.7  --     Estimated Creatinine Clearance: 20.9 mL/min (A) (by C-G formula based on SCr of 2.55 mg/dL (H)).    Allergies  Allergen Reactions  . Penicillins Anaphylaxis  . Oxycodone Nausea And Vomiting  . Propoxyphene Nausea And Vomiting    Antimicrobials this admission: Vancomycin 3/18  >>  Zosyn 3/19  >>   Dose adjustments this admission:   Microbiology results: 3/18 BCx: NGTD 3/18 WoundCx: pending  3/19 MRSA PCR: negative   Thank you for allowing pharmacy to be a part of this patient's care.  Luisa HartChristy, Kyona Chauncey D 07/03/2017 1:16 PM

## 2017-07-03 NOTE — Consult Note (Signed)
Date: 07/03/2017                  Patient Name:  Rebecca Ball  MRN: [id number]  DOB: 1941/02/12  Age / Sex: 77 y.o., female         PCP: Leotis Shames, MD                 Service Requesting Consult: IM/ Adrian Saran, MD                 Reason for Consult: AKI            History of Present Illness: Patient is a 77 y.o. 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 aspirin 81 MG chewable tablet Chew 81 mg by mouth daily.   07/02/2017 at Unknown time  . hydrochlorothiazide (HYDRODIURIL) 25 MG tablet Take 25 mg by mouth daily.   07/02/2017 at Unknown time  . insulin aspart (NOVOLOG) 100 UNIT/ML injection Inject into the skin 3 (three) times daily before meals.   07/02/2017 at Unknown time  . metFORMIN (GLUCOPHAGE) 1000 MG tablet Take 1,000 mg by mouth 2 (two) times daily with a meal.   07/02/2017 at Unknown time  . metoprolol succinate (TOPROL XL) 25 MG 24 hr tablet Take 25 mg by mouth daily.   07/02/2017 at Unknown time  . simvastatin (ZOCOR) 20 MG tablet Take 20 mg by mouth daily.   07/02/2017 at Unknown time    Current medications: Current Facility-Administered Medications  Medication Dose Route Frequency Provider Last Rate Last Dose  . 0.9 %  sodium chloride infusion   Intravenous Continuous Cammy Copa, MD 150 mL/hr at 07/03/17 0816    . acetaminophen (TYLENOL)  tablet 650 mg  650 mg Oral Q6H PRN Cammy Copa, MD       Or  . acetaminophen (TYLENOL) suppository 650 mg  650 mg Rectal Q6H PRN Cammy Copa, MD      . aspirin chewable tablet 81 mg  81 mg Oral Daily Cammy Copa, MD      . bisacodyl (DULCOLAX) EC tablet 5 mg  5 mg Oral Daily PRN Cammy Copa, MD      . dextrose 5 %-0.45 % sodium chloride infusion   Intravenous Continuous Cammy Copa, MD   Stopped at 07/03/17 [phone]  . docusate sodium (COLACE) capsule 100 mg  100 mg Oral BID Cammy Copa, MD      . heparin injection 5,000 Units  5,000 Units Subcutaneous Q8H Cammy Copa, MD   5,000 Units at 07/03/17 [phone]  . HYDROcodone-acetaminophen (NORCO/VICODIN) 5-325 MG per tablet 1-2 tablet  1-2 tablet Oral Q4H PRN Cammy Copa, MD      . insulin aspart (novoLOG) injection 0-5 Units  0-5 Units Subcutaneous QHS Eugenie Norrie, NP      . insulin aspart (novoLOG) injection 0-9 Units  0-9 Units Subcutaneous TID WC Eugenie Norrie, NP   1 Units at 07/03/17  1610  . insulin glargine (LANTUS) injection 10 Units  10 Units Subcutaneous Daily Eugenie Norrie, NP   10 Units at 07/03/17 [phone]  . insulin regular (NOVOLIN R,HUMULIN R) 100 Units in sodium chloride 0.9 % 100 mL (1 Units/mL) infusion   Intravenous Continuous Cammy Copa, MD   Stopped at 07/03/17 [phone]  . magnesium sulfate IVPB 4 g 100 mL  4 g Intravenous Once Eugenie Norrie, NP 50 mL/hr at 07/03/17 0810 4 g at 07/03/17 0810  . metoprolol succinate (TOPROL-XL) 24 hr tablet 25 mg  25 mg Oral Daily Cammy Copa, MD      . ondansetron [facility]) tablet 4 mg  4 mg Oral Q6H PRN Cammy Copa, MD       Or  . ondansetron [facility]) injection 4 mg  4 mg Intravenous Q6H PRN Cammy Copa, MD   4 mg at 07/03/17 0046  . piperacillin-tazobactam (ZOSYN) IVPB 3.375 g  3.375 g Intravenous Q12H Cammy Copa, MD   Stopped at 07/03/17 [phone]  . [START ON 07/04/2017] pneumococcal 23 valent vaccine (PNU-IMMUNE) injection 0.5 mL  0.5 mL Intramuscular Tomorrow-1000 Buono,  Beth A, RN      . simvastatin (ZOCOR) tablet 20 mg  20 mg Oral Daily Cammy Copa, MD      . traZODone (DESYREL) tablet 25 mg  25 mg Oral QHS PRN Cammy Copa, MD      . vancomycin (VANCOCIN) IVPB 750 mg/150 ml premix  750 mg Intravenous Q36H Cammy Copa, MD          Allergies: Allergies  Allergen Reactions  . Penicillins Anaphylaxis  . Oxycodone Nausea And Vomiting  . Propoxyphene Nausea And Vomiting      Past Medical History: Past Medical History:  Diagnosis Date  . Diabetes mellitus without complication (HCC)   . Hypertension      Past Surgical History: History reviewed. No pertinent surgical history.   Family History: Family History  Problem Relation Age of Onset  . Breast cancer Paternal Aunt      Social History: Social History   Socioeconomic History  . Marital status: Married    Spouse name: Not on file  . Number of children: Not on file  . Years of education: Not on file  . Highest education level: Not on file  Social Needs  . Financial resource strain: Not on file  . Food insecurity - worry: Not on file  . Food insecurity - inability: Not on file  . Transportation needs - medical: Not on file  . Transportation needs - non-medical: Not on file  Occupational History  . Not on file  Tobacco Use  . Smoking status: Never Smoker  . Smokeless tobacco: Never Used  Substance and Sexual Activity  . Alcohol use: Not on file  . Drug use: Not on file  . Sexual activity: Not on file  Other Topics Concern  . Not on file  Social History Narrative  . Not on file     Review of Systems: Gen: + malaise HEENT:  No c/o CV: no chest pain Resp: no sob, cough or sputum GI:+ nausea and vomiting GU : decreased UOP MS: no acute c/o Derm:  no c/o Psych:no c/o Heme: no c/o Neuro: no c/o Endocrine. Diabetes  Vital Signs: Blood pressure (!) 121/52, pulse (!) 108, temperature 99.5 F (37.5 C), temperature source Oral, resp. rate 19, height 5\' 4"  (1.626  m), weight 94.2 kg (207 lb 10.8 oz), SpO2 94 %.   Intake/Output Summary (Last  24 hours) at 07/03/2017 0844 Last data filed at 07/03/2017 0816 Gross per 24 hour  Intake 3412.44 ml  Output -  Net 3412.44 ml    Weight trends: Filed Weights   07/02/17 1658 07/03/17 0112  Weight: 93.4 kg (206 lb) 94.2 kg (207 lb 10.8 oz)    Physical Exam: General:  Laying in bed, NAD  HEENT Anicteric, moist oral mucus membranes  Neck:  supple  Lungs: Clear to auscultation  Heart::  regular  Abdomen: Soft, NT  Extremities:  no edema  Neurologic: Alert, oreinted  Skin: No acute rashes             Lab results: Basic Metabolic Panel: Recent Labs  Lab 07/02/17 1813 07/03/17 0125 07/03/17 0612  NA 128* 130* 135  K 2.8* 2.7* 3.0*  CL 91* 97* 103  CO2 21* 19* 20*  GLUCOSE 269* 174* 146*  BUN 65* 64* 62*  CREATININE 3.26* 2.76* 2.55*  CALCIUM 8.2* 7.4* 7.6*  MG  --   --  1.4*    Liver Function Tests: No results for input(s): AST, ALT, ALKPHOS, BILITOT, PROT, ALBUMIN in the last 168 hours. No results for input(s): LIPASE, AMYLASE in the last 168 hours. No results for input(s): AMMONIA in the last 168 hours.  CBC: Recent Labs  Lab 07/02/17 1813 07/03/17 0125  WBC 20.1* 15.2*  NEUTROABS 18.7*  --   HGB 11.2* 10.2*  HCT 35.6 32.4*  MCV 72.2* 72.4*  PLT 302 256    Cardiac Enzymes: No results for input(s): CKTOTAL, TROPONINI in the last 168 hours.  BNP: Invalid input(s): POCBNP  CBG: Recent Labs  Lab 07/03/17 0303 07/03/17 0409 07/03/17 0456 07/03/17 0620 07/03/17 0734  GLUCAP 156* 162* 152* 136* 126*    Microbiology: Recent Results (from the past 720 hour(s))  Culture, blood (routine x 2)     Status: None (Preliminary result)   Collection Time: 07/02/17  6:13 PM  Result Value Ref Range Status   Specimen Description BLOOD LEFT ASSIST CONTROL  Final   Special Requests   Final    BOTTLES DRAWN AEROBIC AND ANAEROBIC Blood Culture adequate volume   Culture   Final     NO GROWTH < 12 HOURS Performed at [facility], [address] [place], [place] [address]    Report Status PENDING  Incomplete  Culture, blood (routine x 2)     Status: None (Preliminary result)   Collection Time: 07/02/17  6:13 PM  Result Value Ref Range Status   Specimen Description BLOOD RIGHT ASSIST CONTROL  Final   Special Requests   Final    BLOOD Blood Culture results may not be optimal due to an excessive volume of blood received in culture bottles   Culture   Final    NO GROWTH < 12 HOURS Performed at [facility], [address] [place], [place] [address]    Report Status PENDING  Incomplete  Wound or Superficial Culture     Status: None (Preliminary result)   Collection Time: 07/02/17  6:13 PM  Result Value Ref Range Status   Specimen Description   Final    BUTTOCKS Performed at [facility], [address] [place], [place] [address]    Special Requests   Final    Normal Performed at [facility], [address] [place], [place] [address]    Gram Stain   Final    NO WBC SEEN NO SQUAMOUS EPITHELIAL CELLS SEEN ABUNDANT GRAM NEGATIVE RODS MODERATE  GRAM POSITIVE COCCI IN CLUSTERS MODERATE GRAM POSITIVE RODS Performed at [facility], [address], [place], [place] [address]    Culture PENDING  Incomplete   Report Status PENDING  Incomplete  MRSA PCR Screening     Status: None   Collection Time: 07/03/17  1:05 AM  Result Value Ref Range Status   MRSA by PCR NEGATIVE NEGATIVE Final    Comment:        The GeneXpert MRSA Assay (FDA approved for NASAL specimens only), is one component of a comprehensive MRSA colonization surveillance program. It is not intended to diagnose MRSA infection nor to guide or monitor treatment for MRSA infections. Performed at [facility], [address] [place], [place] [address]      Coagulation Studies: No results for input(s): LABPROT, INR in the last 72  hours.  Urinalysis: No results for input(s): COLORURINE, LABSPEC, PHURINE, GLUCOSEU, HGBUR, BILIRUBINUR, KETONESUR, PROTEINUR, UROBILINOGEN, NITRITE, LEUKOCYTESUR in the last 72 hours.  Invalid input(s): APPERANCEUR      Imaging: US Pelvis Limited (transabdominal Only)  Result Date: 07/02/2017 CLINICAL DATA:  Initial evaluation for right buttock abscess for 1 week. EXAM: LIMITED ULTRASOUND OF PELVIS TECHNIQUE: Limited transabdominal ultrasound examination of the pelvis was performed. COMPARISON:  Prior CT from 08/16/2005. FINDINGS: Targeted ultrasound of in area of concern at the medial aspect of the right buttock was performed. Ultrasound demonstrates a complex hypoechoic area/collection measuring 1.1 x 1.0 x 1.1 cm. There appears to be a communicating sinus tract extending towards the skin. Collection is positioned approximately 4-5 mm deep to the skin. No other discrete fluid collections identified. Per technologist note, patient's entire right buttock is erythematous and swollen, suggesting cellulitis. Ill-defined soft tissue edema is seen throughout the remaining visualized areas of the right buttock. IMPRESSION: 1.1 x 1.0 x 1.1 cm complex hypoechoic collection positioned within the medial aspect of the right buttock, approximately 4-5 mm deep to the skin, with associated linear tract extending towards the skin superficially. Finding most consistent with a small abscess with draining sinus tract. Surrounding soft tissue swelling edema suggestive of associated cellulitis. Electronically Signed   By: Rise Mu M.D.   On: 07/02/2017 20:55      Assessment & Plan: Pt is a 77 y.o. 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 AKI - likely  secondary to abscess, dehydration from poor po intake - Agree with iv fluid  - treatment of underlying abscess with broad spectrum abx as per Primary team - avoid nephrotoxins, NSAIDs, iv contrast - Dose adjust meds for renal insufficiency in collaboration with pharmacy - Electrolytes and Volume status are acceptable No acute indication for Dialysis at present  2. Hypokalemia - replace as necessary     LOS: 1 Skiler Tye Thedore Mins 3/19/20198:44 AM  [facility] [place], [place] [phone]  Note: This note was prepared with Dragon dictation. Any transcription errors are unintentional

## 2017-07-03 NOTE — Consult Note (Signed)
Name: Rebecca Ball MRN: [id number] DOB: 01/26/1941    ADMISSION DATE:  07/02/2017 CONSULTATION DATE: 07/02/2017  REFERRING MD : Dr. Caryn BeeMaier   CHIEF COMPLAINT: Abscess   BRIEF PATIENT DESCRIPTION:  77 yo female admitted with right buttock abscess with associated cellulits, sepsis and DKA requiring insulin gtt   SIGNIFICANT EVENTS  03/18-Pt admitted to stepdown unit with right buttock abscess and DKA.  US Pelvis Limited revealed 1 x 1.0 x 1.1 cm complex hypoechoic collection positioned within the medial aspect of the right buttock, approximately 4-5 mm deep to the skin, with associated linear tract extending towards the skin superficially. Finding most consistent with a small abscess with draining sinus tract. Surrounding soft tissue swelling edema suggestive of associated cellulitis.  HISTORY OF PRESENT ILLNESS:   This is a 77 yo female with a PMH as listed below who presented to [facility] ER 03/18 with a draining right buttocks abscess onset 1 week prior to presentation.  She also endorses nausea, vomiting, fever, chills, and decreased po intake.  In the ER US Pelvis revealed a small central abscess with surrounding cellulitis of the right buttocks.  Lab results revealed Na+ 128, K+ 2.8, serum glucose 269, BUN 65, creatinine 3.26, anion gap 16, wbc 20.1, and lactic acid 2.6 ruling pt in for DKA and sepsis.  Therefore, sepsis protocol initiated and insulin gtt ordered.  She was subsequently admitted to the stepdown unit by hospitalist team for further workup and treatment.    PAST MEDICAL HISTORY :   has a past medical history of Diabetes mellitus without complication (HCC) and Hypertension.  has no past surgical history on file. Prior to Admission medications   Medication Sig Start Date End Date Taking? Authorizing Provider  aspirin 81 MG chewable tablet Chew 81 mg by mouth daily.   Yes [provider]  hydrochlorothiazide (HYDRODIURIL) 25 MG tablet Take 25 mg by mouth daily.   Yes  [provider]  insulin aspart (NOVOLOG) 100 UNIT/ML injection Inject into the skin 3 (three) times daily before meals.   Yes [provider]  metFORMIN (GLUCOPHAGE) 1000 MG tablet Take 1,000 mg by mouth 2 (two) times daily with a meal.   Yes [provider]  metoprolol succinate (TOPROL XL) 25 MG 24 hr tablet Take 25 mg by mouth daily.   Yes [provider]  simvastatin (ZOCOR) 20 MG tablet Take 20 mg by mouth daily.   Yes [provider]   Allergies  Allergen Reactions  . Penicillins Anaphylaxis  . Oxycodone Nausea And Vomiting  . Propoxyphene Nausea And Vomiting    FAMILY HISTORY:  family history includes Breast cancer in her paternal aunt. SOCIAL HISTORY:    REVIEW OF SYSTEMS: Positives in BOLD  Constitutional: fever, chills, weight loss, malaise/fatigue and diaphoresis.  HENT: Negative for hearing loss, ear pain, nosebleeds, congestion, sore throat, neck pain, tinnitus and ear discharge.   Eyes: Negative for blurred vision, double vision, photophobia, pain, discharge and redness.  Respiratory: Negative for cough, hemoptysis, sputum production, shortness of breath, wheezing and stridor.   Cardiovascular: Negative for chest pain, palpitations, orthopnea, claudication, leg swelling and PND.  Gastrointestinal: heartburn, nausea, vomiting, abdominal pain, diarrhea, constipation, blood in stool and melena.  Genitourinary: Negative for dysuria, urgency, frequency, hematuria and flank pain.  Musculoskeletal: Negative for myalgias, back pain, joint pain and falls.  Skin: right buttocks abscess, itching and rash.  Neurological: Negative for dizziness, tingling, tremors, sensory change, speech change, focal weakness, seizures, loss of consciousness, weakness  and headaches.  Endo/Heme/Allergies: Negative for environmental allergies and polydipsia. Does not bruise/bleed easily.  SUBJECTIVE:  c/o right buttocks pain   VITAL SIGNS: Temp:  [98.9  F (37.2 C)-99.4 F (37.4 C)] 99.4 F (37.4 C) (03/18 2353) Pulse Rate:  [96-102] 98 (03/18 2353) Resp:  [18] 18 (03/18 2353) BP: (105-117)/(49-61) 116/54 (03/18 2353) SpO2:  [95 %-98 %] 95 % (03/18 2353) Weight:  [93.4 kg (206 lb)] 93.4 kg (206 lb) (03/18 1658)  PHYSICAL EXAMINATION: General: well developed, well nourished female, NAD  Neuro: alert and oriented, follows commands HEENT: supple, no JVD  Cardiovascular: nsr, s1s2, no M/R/G Lungs: clear throughout, even, non labored  Abdomen: +BS x4, obese, soft, non tender, non distended  Musculoskeletal: normal bulk and tone, no edema  Skin: right buttocks abscess with drainage and erythema of surrounding skin  Recent Labs  Lab 07/02/17 1813  NA 128*  K 2.8*  CL 91*  CO2 21*  BUN 65*  CREATININE 3.26*  GLUCOSE 269*   Recent Labs  Lab 07/02/17 1813  HGB 11.2*  HCT 35.6  WBC 20.1*  PLT 302   US Pelvis Limited (transabdominal Only)  Result Date: 07/02/2017 CLINICAL DATA:  Initial evaluation for right buttock abscess for 1 week. EXAM: LIMITED ULTRASOUND OF PELVIS TECHNIQUE: Limited transabdominal ultrasound examination of the pelvis was performed. COMPARISON:  Prior CT from 08/16/2005. FINDINGS: Targeted ultrasound of in area of concern at the medial aspect of the right buttock was performed. Ultrasound demonstrates a complex hypoechoic area/collection measuring 1.1 x 1.0 x 1.1 cm. There appears to be a communicating sinus tract extending towards the skin. Collection is positioned approximately 4-5 mm deep to the skin. No other discrete fluid collections identified. Per technologist note, patient's entire right buttock is erythematous and swollen, suggesting cellulitis. Ill-defined soft tissue edema is seen throughout the remaining visualized areas of the right buttock. IMPRESSION: 1.1 x 1.0 x 1.1 cm complex hypoechoic collection positioned within the medial aspect of the right buttock, approximately 4-5 mm deep to the skin, with  associated linear tract extending towards the skin superficially. Finding most consistent with a small abscess with draining sinus tract. Surrounding soft tissue swelling edema suggestive of associated cellulitis. Electronically Signed   By: Rise Mu M.D.   On: 07/02/2017 20:55    ASSESSMENT / PLAN: Sepsis secondary to right buttocks abscess with surrounding cellulitis  Acute renal failure  Lactic acidosis  Diabetic Ketoacidosis Hx: HTN P: Continuous telemetry monitoring Maintain map >65  Trend WBC and monitor fever curve Trend PCT and lactic acid  Follow cultures  Continue broad spectrum abx General Surgery and Infectious Disease consulted appreciate input  DKA protocol until anion gap closed and serum CO2 >20 Trend BMP  Replace electrolytes as indicated  Monitor UOP VTE px: subq heparin Trend CBC  Monitor for s/sx of bleeding and transfuse for hgb <7 Prn norco for pain management   Sonda Rumble, AGNP  Pulmonary/Critical Care Pager [phone] (please enter 7 digits) PCCM Consult Pager [phone] (please enter 7 digits)

## 2017-07-04 LAB — BASIC METABOLIC PANEL
Anion gap: 12 (ref 5–15)
BUN: 55 mg/dL — AB (ref 6–20)
CALCIUM: 7.8 mg/dL — AB (ref 8.9–10.3)
CHLORIDE: 104 mmol/L (ref 101–111)
CO2: 19 mmol/L — AB (ref 22–32)
CREATININE: 1.84 mg/dL — AB (ref 0.44–1.00)
GFR calc Af Amer: 30 mL/min — ABNORMAL LOW (ref >60)
GFR calc non Af Amer: 26 mL/min — ABNORMAL LOW (ref >60)
Glucose, Bld: 195 mg/dL — ABNORMAL HIGH (ref 65–99)
Potassium: 3 mmol/L — ABNORMAL LOW (ref 3.5–5.1)
SODIUM: 135 mmol/L (ref 135–145)

## 2017-07-04 LAB — AEROBIC CULTURE  (SUPERFICIAL SPECIMEN): GRAM STAIN: NONE SEEN

## 2017-07-04 LAB — CBC
HCT: 33.3 % — ABNORMAL LOW (ref 35.0–47.0)
Hemoglobin: 10.6 g/dL — ABNORMAL LOW (ref 12.0–16.0)
MCH: 22.9 pg — AB (ref 26.0–34.0)
MCHC: 31.9 g/dL — ABNORMAL LOW (ref 32.0–36.0)
MCV: 71.8 fL — AB (ref 80.0–100.0)
PLATELETS: 347 10*3/uL (ref 150–440)
RBC: 4.64 MIL/uL (ref 3.80–5.20)
RDW: 17.3 % — AB (ref 11.5–14.5)
WBC: 16.5 10*3/uL — ABNORMAL HIGH (ref 3.6–11.0)

## 2017-07-04 LAB — GLUCOSE, CAPILLARY
Glucose-Capillary: 166 mg/dL — ABNORMAL HIGH (ref 65–99)
Glucose-Capillary: 249 mg/dL — ABNORMAL HIGH (ref 65–99)
Glucose-Capillary: 270 mg/dL — ABNORMAL HIGH (ref 65–99)
Glucose-Capillary: 197 mg/dL — ABNORMAL HIGH (ref 65–99)

## 2017-07-04 LAB — AEROBIC CULTURE W GRAM STAIN (SUPERFICIAL SPECIMEN): Special Requests: NORMAL

## 2017-07-04 LAB — MAGNESIUM: Magnesium: 2.2 mg/dL (ref 1.7–2.4)

## 2017-07-04 LAB — PROCALCITONIN: Procalcitonin: 2.3 ng/mL

## 2017-07-04 MED ORDER — SODIUM CHLORIDE 0.9 % IV SOLN
INTRAVENOUS | Status: DC
  Administered 2017-07-05 – 2017-07-06 (×4): via INTRAVENOUS

## 2017-07-04 MED ORDER — POTASSIUM CHLORIDE CRYS ER 20 MEQ PO TBCR
40.0000 meq | EXTENDED_RELEASE_TABLET | Freq: Once | ORAL | Status: AC
  Administered 2017-07-04: 40 meq via ORAL
  Filled 2017-07-04: qty 2

## 2017-07-04 MED ORDER — CHLORHEXIDINE GLUCONATE CLOTH 2 % EX PADS
6.0000 | MEDICATED_PAD | Freq: Once | CUTANEOUS | Status: AC
  Administered 2017-07-04: 6 via TOPICAL

## 2017-07-04 NOTE — Progress Notes (Signed)
[facility] Date of Admission:  07/02/2017     ID: Rebecca Ball is a 77 y.o. female with   DKA and buttock abscess. Active Problems:   Cellulitis and abscess of buttock   DKA (diabetic ketoacidoses) (HCC)   Acute renal failure (HCC)   Subjective: Out of unit, no fevers, still with buttock pain. Temp 100.4, wbc 16  ROS  Eleven systems are reviewed and negative except per hpi  Medications:  Antibiotics Given (last 72 hours)    Date/Time Action Medication Dose Rate   07/02/17 2055 New Bag/Given   vancomycin (VANCOCIN) IVPB 1000 mg/200 mL premix 1,000 mg 200 mL/hr   07/03/17 0203 New Bag/Given   piperacillin-tazobactam (ZOSYN) IVPB 3.375 g 3.375 g 12.5 mL/hr   07/03/17 1334 New Bag/Given   piperacillin-tazobactam (ZOSYN) IVPB 3.375 g 3.375 g 12.5 mL/hr   07/03/17 1454 New Bag/Given   vancomycin (VANCOCIN) IVPB 750 mg/150 ml premix 750 mg 150 mL/hr   07/04/17 0123 New Bag/Given  [had to reset computer]   piperacillin-tazobactam (ZOSYN) IVPB 3.375 g 3.375 g 12.5 mL/hr   07/04/17 1335 New Bag/Given   piperacillin-tazobactam (ZOSYN) IVPB 3.375 g 3.375 g 12.5 mL/hr     . aspirin  81 mg Oral Daily  . Chlorhexidine Gluconate Cloth  6 each Topical Once   And  . Chlorhexidine Gluconate Cloth  6 each Topical Once  . docusate sodium  100 mg Oral BID  . heparin  5,000 Units Subcutaneous Q8H  . insulin aspart  0-5 Units Subcutaneous QHS  . insulin aspart  0-9 Units Subcutaneous TID WC  . insulin glargine  10 Units Subcutaneous Daily  . metoprolol succinate  25 mg Oral Daily  . multivitamin with minerals  1 tablet Oral Daily  . pneumococcal 23 valent vaccine  0.5 mL Intramuscular Tomorrow-1000  . protein supplement shake  11 oz Oral BID BM  . simvastatin  20 mg Oral Daily    Objective: Vital signs in last 24 hours: Temp:  [98.6 F (37 C)-100.4 F (38 C)] 98.6 F (37 C) (03/20 0931) Pulse Rate:  [74-86] 75 (03/20 0931) Resp:  [19] 19  (03/20 0931) BP: (99-107)/(31-44) 105/42 (03/20 0931) SpO2:  [91 %-96 %] 95 % (03/20 0931) Constitutional:  oriented to person, place, and time. appears well-developed and well-nourished. No distress.  HENT: [place]/AT, PERRLA, no scleral icterus Mouth/Throat: Oropharynx is clear and moist. No oropharyngeal exudate.  Cardiovascular: Normal rate, regular rhythm and normal heart sounds. Exam reveals no gallop and no friction rub.  No murmur heard.  Pulmonary/Chest: Effort normal and breath sounds normal. No respiratory distress.  has no wheezes.  Neck = supple, no nuchal rigidity Abdominal: Soft. Bowel sounds are normal.  exhibits no distension. There is no tenderness.  Lymphadenopathy: no cervical adenopathy. No axillary adenopathy Neurological: alert and oriented to person, place, and time.  Skin: draining gluteal abscess on the patient's right side with marked induration - I am able to express thick brown tan  fluid  Psychiatric: a normal mood and affect.  behavior is normal.     Lab Results Recent Labs    07/03/17 0125 07/03/17 0612 07/04/17 0424  WBC 15.2*  --  16.5*  HGB 10.2*  --  10.6*  HCT 32.4*  --  33.3*  NA 130* 135 135  K 2.7* 3.0* 3.0*  CL 97* 103 104  CO2 19* 20* 19*  BUN 64* 62* 55*  CREATININE 2.76* 2.55* 1.84*    Microbiology:  Results for orders placed or performed during the hospital encounter of 07/02/17  Culture, blood (routine x 2)     Status: None (Preliminary result)   Collection Time: 07/02/17  6:13 PM  Result Value Ref Range Status   Specimen Description BLOOD LEFT ASSIST CONTROL  Final   Special Requests   Final    BOTTLES DRAWN AEROBIC AND ANAEROBIC Blood Culture adequate volume   Culture   Final    NO GROWTH 2 DAYS Performed at [facility], [address], [place], [place] [address]    Report Status PENDING  Incomplete  Culture, blood (routine x 2)     Status: None (Preliminary result)   Collection Time: 07/02/17  6:13 PM  Result  Value Ref Range Status   Specimen Description BLOOD RIGHT ASSIST CONTROL  Final   Special Requests   Final    BLOOD Blood Culture results may not be optimal due to an excessive volume of blood received in culture bottles   Culture   Final    NO GROWTH 2 DAYS Performed at [facility], [address] [place], [place] [address]    Report Status PENDING  Incomplete  Wound or Superficial Culture     Status: Abnormal   Collection Time: 07/02/17  6:13 PM  Result Value Ref Range Status   Specimen Description   Final    BUTTOCKS Performed at [facility], [address] [place], [place] [address]    Special Requests   Final    Normal Performed at [facility], [address] [place], [place] [address]    Gram Stain   Final    NO WBC SEEN NO SQUAMOUS EPITHELIAL CELLS SEEN ABUNDANT GRAM NEGATIVE RODS MODERATE GRAM POSITIVE COCCI IN CLUSTERS MODERATE GRAM POSITIVE RODS Performed at [facility], [address], [place], [place] [address]    Culture MULTIPLE ORGANISMS PRESENT, NONE PREDOMINANT (A)  Final   Report Status 07/04/2017 FINAL  Final  MRSA PCR Screening     Status: None   Collection Time: 07/03/17  1:05 AM  Result Value Ref Range Status   MRSA by PCR NEGATIVE NEGATIVE Final    Comment:        The GeneXpert MRSA Assay (FDA approved for NASAL specimens only), is one component of a comprehensive MRSA colonization surveillance program. It is not intended to diagnose MRSA infection nor to guide or monitor treatment for MRSA infections. Performed at [facility], [address] [place], [place] [address]     Studies/Results: US Pelvis Limited (transabdominal Only)  Result Date: 07/02/2017 CLINICAL DATA:  Initial evaluation for right buttock abscess for 1 week. EXAM: LIMITED ULTRASOUND OF PELVIS TECHNIQUE: Limited transabdominal ultrasound examination of the pelvis was performed. COMPARISON:  Prior CT from 08/16/2005.  FINDINGS: Targeted ultrasound of in area of concern at the medial aspect of the right buttock was performed. Ultrasound demonstrates a complex hypoechoic area/collection measuring 1.1 x 1.0 x 1.1 cm. There appears to be a communicating sinus tract extending towards the skin. Collection is positioned approximately 4-5 mm deep to the skin. No other discrete fluid collections identified. Per technologist note, patient's entire right buttock is erythematous and swollen, suggesting cellulitis. Ill-defined soft tissue edema is seen throughout the remaining visualized areas of the right buttock. IMPRESSION: 1.1 x 1.0 x 1.1 cm complex hypoechoic collection positioned within the medial aspect of the right buttock, approximately 4-5 mm deep to the skin, with associated linear tract extending towards  the skin superficially. Finding most consistent with a small abscess with draining sinus tract. Surrounding soft tissue swelling edema suggestive of associated cellulitis. Electronically Signed   By: Rise Mu M.D.   On: 07/02/2017 20:55    Assessment/Plan: SHALAINE PAYSON is a 77 y.o. female with DM (A1c 7.5 in Nov 2018) admitted with R buttock abscess now self draining and DKA.  Gram stain and culture aremixed. On vanco and zosyn. Clinically improving.  MRSA PCR negative. No evidence MRSA at this point so can dc vanco esp with AOCKD.  I agree with surgery and will likely need formal I and D if not much better tomorrow Recommendations Dc vanco Cont  zosyn Monitor for need for debridement.  To be taken to OR tomorrow if no improvement  Thank you very much for the consult. Will follow with you.  Mick Sell   07/04/2017, 2:48 PM

## 2017-07-04 NOTE — Progress Notes (Signed)
CC: Gluteal abscess Subjective: Patient was transferred out of the ICU overnight as her DKA improved.  Patient reports having a rough night secondary to confusion.  Otherwise she states she is feeling a little bit better.  Continues to be tender to her backside when she tries to sit up.  Objective: Vital signs in last 24 hours: Temp:  [98.9 F (37.2 C)-100.4 F (38 C)] 98.9 F (37.2 C) (03/20 0451) Pulse Rate:  [74-100] 74 (03/20 0451) Resp:  [18-26] 19 (03/20 0451) BP: (99-135)/(31-61) 99/31 (03/20 0451) SpO2:  [91 %-96 %] 96 % (03/20 0451) Last BM Date: 07/03/17  Intake/Output from previous day: 03/19 0701 - 03/20 0700 In: 2532.7 [P.O.:300; I.V.:2082.7; IV Piggyback:150] Out: 0  Intake/Output this shift: Total I/O In: 394 [I.V.:394] Out: -   Physical exam:  General: No acute distress Chest: Clear to auscultation Heart: Rate and rhythm Abdomen: Soft nontender Skin: Right gluteal abscess examined 2.  Continues to spontaneously drain.  Has increased erythema proximally today from yesterday.  Improved fluctuance though.  Lab Results: CBC  Recent Labs    07/03/17 0125 07/04/17 0424  WBC 15.2* 16.5*  HGB 10.2* 10.6*  HCT 32.4* 33.3*  PLT 256 347   BMET Recent Labs    07/03/17 0612 07/04/17 0424  NA 135 135  K 3.0* 3.0*  CL 103 104  CO2 20* 19*  GLUCOSE 146* 195*  BUN 62* 55*  CREATININE 2.55* 1.84*  CALCIUM 7.6* 7.8*   PT/INR No results for input(s): LABPROT, INR in the last 72 hours. ABG No results for input(s): PHART, HCO3 in the last 72 hours.  Invalid input(s): PCO2, PO2  Studies/Results: US Pelvis Limited (transabdominal Only)  Result Date: 07/02/2017 CLINICAL DATA:  Initial evaluation for right buttock abscess for 1 week. EXAM: LIMITED ULTRASOUND OF PELVIS TECHNIQUE: Limited transabdominal ultrasound examination of the pelvis was performed. COMPARISON:  Prior CT from 08/16/2005. FINDINGS: Targeted ultrasound of in area of concern at the medial  aspect of the right buttock was performed. Ultrasound demonstrates a complex hypoechoic area/collection measuring 1.1 x 1.0 x 1.1 cm. There appears to be a communicating sinus tract extending towards the skin. Collection is positioned approximately 4-5 mm deep to the skin. No other discrete fluid collections identified. Per technologist note, patient's entire right buttock is erythematous and swollen, suggesting cellulitis. Ill-defined soft tissue edema is seen throughout the remaining visualized areas of the right buttock. IMPRESSION: 1.1 x 1.0 x 1.1 cm complex hypoechoic collection positioned within the medial aspect of the right buttock, approximately 4-5 mm deep to the skin, with associated linear tract extending towards the skin superficially. Finding most consistent with a small abscess with draining sinus tract. Surrounding soft tissue swelling edema suggestive of associated cellulitis. Electronically Signed   By: Rise Mu M.D.   On: 07/02/2017 20:55    Anti-infectives: Anti-infectives (From admission, onward)   Start     Dose/Rate Route Frequency Ordered Stop   07/04/17 1000  ceFEPIme (MAXIPIME) 1 g in sodium chloride 0.9 % 100 mL IVPB  Status:  Discontinued     1 g 200 mL/hr over 30 Minutes Intravenous Every 24 hours 07/03/17 0025 07/03/17 0031   07/03/17 1500  vancomycin (VANCOCIN) IVPB 750 mg/150 ml premix  Status:  Discontinued     750 mg 150 mL/hr over 60 Minutes Intravenous Every 36 hours 07/02/17 2356 07/03/17 1314   07/03/17 1500  vancomycin (VANCOCIN) IVPB 750 mg/150 ml premix     750 mg 150 mL/hr over 60  Minutes Intravenous Every 24 hours 07/03/17 1314     07/03/17 0200  piperacillin-tazobactam (ZOSYN) IVPB 3.375 g     3.375 g 12.5 mL/hr over 240 Minutes Intravenous Every 12 hours 07/03/17 0033     07/03/17 0030  ceFEPIme (MAXIPIME) 2 g in sodium chloride 0.9 % 100 mL IVPB  Status:  Discontinued     2 g 200 mL/hr over 30 Minutes Intravenous  Once 07/03/17 0025  07/03/17 0031   07/02/17 2030  vancomycin (VANCOCIN) IVPB 1000 mg/200 mL premix     1,000 mg 200 mL/hr over 60 Minutes Intravenous  Once 07/02/17 2016 07/02/17 2207   07/02/17 1945  vancomycin (VANCOCIN) 2,000 mg in sodium chloride 0.9 % 500 mL IVPB  Status:  Discontinued     2,000 mg 250 mL/hr over 120 Minutes Intravenous  Once 07/02/17 1944 07/02/17 2016      Assessment/Plan:  77 year old female with a right gluteal abscess.  Mixed picture of progression.  Although there is no additional fluctuance in the area is draining, there is worsening erythema.  Patient had completed a tray of breakfast this morning when I saw her.  Discussed making her n.p.o. after midnight with a planned trip to the operating room for an incision and drainage tomorrow unless there is market improvement.  Patient voiced understanding and agrees with this plan.  She understands she will be evaluated in the morning to determine whether not she has to go the operating room.  Kenora Spayd T. Tonita CongWoodham, MD, [facility] General Surgeon [facility]  Day ASCOM [phone](7a-7p) [phone] Night ASCOM [phone](7p-7a) [phone] 07/04/2017

## 2017-07-04 NOTE — Progress Notes (Signed)
Pharmacy Electrolyte Monitoring Consult:  Pharmacy consulted to assist in monitoring and replacing electrolytes in this 77 y.o. female admitted on 07/02/2017 with Abscess  Patient received potassium 20mEq PO x 1 and potassium 10mEq IV Q1hr x 3 doses on 3/19.  Labs:  Sodium (mmol/L)  Date Value  07/04/2017 135   Potassium (mmol/L)  Date Value  07/04/2017 3.0 (L)   Magnesium (mg/dL)  Date Value  91/47/829503/20/2019 2.2   Calcium (mg/dL)  Date Value  62/13/086503/20/2019 7.8 (L)    Plan:  Will order potassium 40mEq PO x 1. Will recheck electrolytes with am labs.   Pharmacy will continue to monitor and adjust per consult.   Simpson,Michael L 07/04/2017 12:01 PM

## 2017-07-04 NOTE — Progress Notes (Signed)
Pharmacy Antibiotic Note  Robyne PeersJustina R Tippens is a 77 y.o. female admitted on 07/02/2017 with cellulitis with abscess.  Pharmacy has been consulted for vancomycin and Zosyn dosing.  Plan: Continue vancomycin to 750 mg IC Q24hr. Will obtain creatinine with labs. Will tentitively plan to obtain trough prior to dose on 3/21 to help guide therapy.   Zosyn EI 3.375g IV Q8hr.   Height: 5\' 4"  (162.6 cm) Weight: 207 lb 10.8 oz (94.2 kg) IBW/kg (Calculated) : 54.7  Temp (24hrs), Avg:99.2 F (37.3 C), Min:98.6 F (37 C), Max:100.4 F (38 C)  Recent Labs  Lab 07/02/17 1813 07/02/17 1909 07/03/17 0125 07/03/17 0612 07/04/17 0424  WBC 20.1*  --  15.2*  --  16.5*  CREATININE 3.26*  --  2.76* 2.55* 1.84*  LATICACIDVEN  --  2.6* 1.7  --   --     Estimated Creatinine Clearance: 28.9 mL/min (A) (by C-G formula based on SCr of 1.84 mg/dL (H)).    Allergies  Allergen Reactions  . Penicillins Anaphylaxis  . Oxycodone Nausea And Vomiting  . Propoxyphene Nausea And Vomiting    Antimicrobials this admission: Vancomycin 3/18  >>  Zosyn 3/19  >>   Dose adjustments this admission: 3/20 Zosyn transitioned from q12hr to q8hr.    Microbiology results: 3/18 BCx: no growth x 2 days  3/18 WoundCx: multiple organisms present   3/19 MRSA PCR: negative   Thank you for allowing pharmacy to be a part of this patient's care.  Simpson,Michael L 07/04/2017 12:05 PM

## 2017-07-04 NOTE — Progress Notes (Signed)
[employer] - [place] at [facility]   PATIENT NAME: Rebecca SkinnerJustina Ball    MR#:  [id number]  DATE OF BIRTH:  1940/04/30  SUBJECTIVE:  Patient continues to complain of buttock pain and drainage  REVIEW OF SYSTEMS:    Review of Systems  Constitutional: Negative for fever, chills weight loss HENT: Negative for ear pain, nosebleeds, congestion, facial swelling, rhinorrhea, neck pain, neck stiffness and ear discharge.   Respiratory: Negative for cough, shortness of breath, wheezing  Cardiovascular: Negative for chest pain, palpitations and leg swelling.  Gastrointestinal: Negative for heartburn, abdominal pain, vomiting, diarrhea or consitpation Genitourinary: Negative for dysuria, urgency, frequency, hematuria Musculoskeletal: Negative for back pain or joint pain Neurological: Negative for dizziness, seizures, syncope, focal weakness,  numbness and headaches.  Hematological: Does not bruise/bleed easily.  Psychiatric/Behavioral: Negative for hallucinations, confusion, dysphoric mood  Skin: She has a buttock abscess that is draining  Tolerating Diet: yes      DRUG ALLERGIES:   Allergies  Allergen Reactions  . Penicillins Anaphylaxis  . Oxycodone Nausea And Vomiting  . Propoxyphene Nausea And Vomiting    VITALS:  Blood pressure (!) 105/42, pulse 75, temperature 98.6 F (37 C), temperature source Oral, resp. rate 19, height 5\' 4"  (1.626 m), weight 94.2 kg (207 lb 10.8 oz), SpO2 95 %.  PHYSICAL EXAMINATION:  Constitutional: Appears well-developed and well-nourished. No distress. HENT: Normocephalic. Marland Kitchen. Oropharynx is clear and moist.  Eyes: Conjunctivae and EOM are normal. PERRLA, no scleral icterus.  Neck: Normal ROM. Neck supple. No JVD. No tracheal deviation. CVS: RRR, S1/S2 +, no murmurs, no gallops, no carotid bruit.  Pulmonary: Effort and breath sounds normal, no stridor, rhonchi, wheezes, rales.  Abdominal: Soft. BS +,  no distension, tenderness, rebound or  guarding.  Musculoskeletal: Normal range of motion. No edema and no tenderness.  Neuro: Alert. CN 2-12 grossly intact. No focal deficits. Skin: right buttock abscess Psychiatric: Normal mood and affect.      LABORATORY PANEL:   CBC Recent Labs  Lab 07/04/17 0424  WBC 16.5*  HGB 10.6*  HCT 33.3*  PLT 347   ------------------------------------------------------------------------------------------------------------------  Chemistries  Recent Labs  Lab 07/04/17 0424  NA 135  K 3.0*  CL 104  CO2 19*  GLUCOSE 195*  BUN 55*  CREATININE 1.84*  CALCIUM 7.8*  MG 2.2   ------------------------------------------------------------------------------------------------------------------  Cardiac Enzymes No results for input(s): TROPONINI in the last 168 hours. ------------------------------------------------------------------------------------------------------------------  RADIOLOGY:  [place] Pelvis Limited (transabdominal Only)  Result Date: 07/02/2017 CLINICAL DATA:  Initial evaluation for right buttock abscess for 1 week. EXAM: LIMITED ULTRASOUND OF PELVIS TECHNIQUE: Limited transabdominal ultrasound examination of the pelvis was performed. COMPARISON:  Prior CT from 08/16/2005. FINDINGS: Targeted ultrasound of in area of concern at the medial aspect of the right buttock was performed. Ultrasound demonstrates a complex hypoechoic area/collection measuring 1.1 x 1.0 x 1.1 cm. There appears to be a communicating sinus tract extending towards the skin. Collection is positioned approximately 4-5 mm deep to the skin. No other discrete fluid collections identified. Per technologist note, patient's entire right buttock is erythematous and swollen, suggesting cellulitis. Ill-defined soft tissue edema is seen throughout the remaining visualized areas of the right buttock. IMPRESSION: 1.1 x 1.0 x 1.1 cm complex hypoechoic collection positioned within the medial aspect of the right buttock,  approximately 4-5 mm deep to the skin, with associated linear tract extending towards the skin superficially. Finding most consistent with a small abscess with draining sinus tract. Surrounding soft tissue swelling edema  suggestive of associated cellulitis. Electronically Signed   By: Rise Mu M.D.   On: 07/02/2017 20:55     ASSESSMENT AND PLAN:   77 year old female with a history of diabetes who presents due to right buttock abscess.  1. Sepsis: Patient presents with fever, tachycardia and elevated white blood cell count. Sepsis is due to right buttock abscess.   2. Right buttock abscess: Continue vancomycin and Zosyn Patient will be n.p.o. after midnight for incision and drainage tomorrow unless there is marked improvement.  Appreciate surgery and ID evaluation  3. Acute kidney injury in the setting of poor by mouth intake and ATN with sepsis Creatinine improved with IV fluids Appreciate nephrology consultation Hold nephrotoxic medications  4. Electrolyte abnormalities including low potassium and magnesium: Replace and recheck in a.m.  5.  DKA: This has resolved Continue sliding scale  Continue Lantus May not need Lantus at discharge as per diabetes nurse.  6. Essential hypertension: Continue metoprolol  Discussed with Dr. Tonita Cong  Management plans discussed with the patient and she is in agreement.  CODE STATUS: full  TOTAL TIME TAKING CARE OF THIS PATIENT: 24 minutes.     POSSIBLE D/C 2 days, DEPENDING ON CLINICAL CONDITION.   Tillmon Kisling M.D on 07/04/2017 at 11:47 AM  Between 7am to 6pm - Pager - [phone] After 6pm go to [email or web address] - password [email or web address]  [facility] [place] [facility]  Office  [phone]  CC: Primary care physician; Leotis Shames, MD  Note: This dictation was prepared with Dragon dictation along with smaller phrase technology. Any transcriptional errors that result from this process are unintentional.

## 2017-07-05 ENCOUNTER — Encounter
Admission: EM | Disposition: A | Discharge status: Home Health | Payer: Self-pay | Source: Home / Self Care | Attending: Internal Medicine

## 2017-07-05 ENCOUNTER — Other Ambulatory Visit: Payer: Self-pay

## 2017-07-05 ENCOUNTER — Inpatient Hospital Stay: Payer: Medicare Other | Admitting: Anesthesiology

## 2017-07-05 DIAGNOSIS — L03317 Cellulitis of buttock: Secondary | ICD-10-CM

## 2017-07-05 DIAGNOSIS — L0231 Cutaneous abscess of buttock: Secondary | ICD-10-CM

## 2017-07-05 HISTORY — PX: IRRIGATION AND DEBRIDEMENT ABSCESS: SHX5252

## 2017-07-05 LAB — CBC
HEMATOCRIT: 30.8 % — AB (ref 35.0–47.0)
HEMOGLOBIN: 9.8 g/dL — AB (ref 12.0–16.0)
MCH: 22.9 pg — ABNORMAL LOW (ref 26.0–34.0)
MCHC: 31.8 g/dL — ABNORMAL LOW (ref 32.0–36.0)
MCV: 72.1 fL — AB (ref 80.0–100.0)
PLATELETS: 350 10*3/uL (ref 150–440)
RBC: 4.27 MIL/uL (ref 3.80–5.20)
RDW: 17.2 % — ABNORMAL HIGH (ref 11.5–14.5)
WBC: 10.5 10*3/uL (ref 3.6–11.0)

## 2017-07-05 LAB — BASIC METABOLIC PANEL
ANION GAP: 12 (ref 5–15)
BUN: 44 mg/dL — ABNORMAL HIGH (ref 6–20)
CHLORIDE: 109 mmol/L (ref 101–111)
CO2: 19 mmol/L — ABNORMAL LOW (ref 22–32)
CREATININE: 1.43 mg/dL — AB (ref 0.44–1.00)
Calcium: 7.8 mg/dL — ABNORMAL LOW (ref 8.9–10.3)
GFR calc non Af Amer: 35 mL/min — ABNORMAL LOW (ref >60)
GFR, EST AFRICAN AMERICAN: 40 mL/min — AB (ref >60)
Glucose, Bld: 246 mg/dL — ABNORMAL HIGH (ref 65–99)
POTASSIUM: 3.3 mmol/L — AB (ref 3.5–5.1)
SODIUM: 140 mmol/L (ref 135–145)

## 2017-07-05 LAB — PROCALCITONIN: Procalcitonin: 1.04 ng/mL

## 2017-07-05 LAB — GLUCOSE, CAPILLARY
GLUCOSE-CAPILLARY: 220 mg/dL — AB (ref 65–99)
GLUCOSE-CAPILLARY: 216 mg/dL — AB (ref 65–99)
GLUCOSE-CAPILLARY: 199 mg/dL — AB (ref 65–99)
Glucose-Capillary: 236 mg/dL — ABNORMAL HIGH (ref 65–99)
Glucose-Capillary: 238 mg/dL — ABNORMAL HIGH (ref 65–99)

## 2017-07-05 SURGERY — IRRIGATION AND DEBRIDEMENT ABSCESS
Anesthesia: General | Laterality: Right | Wound class: Dirty or Infected

## 2017-07-05 MED ORDER — SUCCINYLCHOLINE CHLORIDE 20 MG/ML IJ SOLN
INTRAMUSCULAR | Status: DC | PRN
  Administered 2017-07-05: 100 mg via INTRAVENOUS

## 2017-07-05 MED ORDER — DEXAMETHASONE SODIUM PHOSPHATE 10 MG/ML IJ SOLN
INTRAMUSCULAR | Status: DC | PRN
  Administered 2017-07-05: 5 mg via INTRAVENOUS

## 2017-07-05 MED ORDER — SUGAMMADEX SODIUM 200 MG/2ML IV SOLN
INTRAVENOUS | Status: DC | PRN
  Administered 2017-07-05: 200 mg via INTRAVENOUS

## 2017-07-05 MED ORDER — PROMETHAZINE HCL 25 MG/ML IJ SOLN
6.2500 mg | INTRAMUSCULAR | Status: DC | PRN
  Administered 2017-07-05: 12.5 mg via INTRAVENOUS

## 2017-07-05 MED ORDER — FENTANYL CITRATE (PF) 100 MCG/2ML IJ SOLN
INTRAMUSCULAR | Status: AC
  Administered 2017-07-05: 25 ug via INTRAVENOUS
  Filled 2017-07-05: qty 2

## 2017-07-05 MED ORDER — LIDOCAINE HCL (PF) 2 % IJ SOLN
INTRAMUSCULAR | Status: AC
  Filled 2017-07-05: qty 10

## 2017-07-05 MED ORDER — ONDANSETRON HCL 4 MG/2ML IJ SOLN
INTRAMUSCULAR | Status: AC
  Filled 2017-07-05: qty 2

## 2017-07-05 MED ORDER — INSULIN GLARGINE 100 UNIT/ML ~~LOC~~ SOLN
14.0000 [IU] | Freq: Every day | SUBCUTANEOUS | Status: DC
  Administered 2017-07-06: 14 [IU] via SUBCUTANEOUS
  Filled 2017-07-05: qty 0.14

## 2017-07-05 MED ORDER — FENTANYL CITRATE (PF) 100 MCG/2ML IJ SOLN
INTRAMUSCULAR | Status: DC | PRN
  Administered 2017-07-05 (×2): 50 ug via INTRAVENOUS

## 2017-07-05 MED ORDER — POTASSIUM CHLORIDE 10 MEQ/100ML IV SOLN
10.0000 meq | INTRAVENOUS | Status: AC
  Administered 2017-07-05 (×3): 10 meq via INTRAVENOUS
  Filled 2017-07-05 (×3): qty 100

## 2017-07-05 MED ORDER — SUGAMMADEX SODIUM 200 MG/2ML IV SOLN
INTRAVENOUS | Status: AC
  Filled 2017-07-05: qty 2

## 2017-07-05 MED ORDER — PIPERACILLIN-TAZOBACTAM 3.375 G IVPB
3.3750 g | Freq: Three times a day (TID) | INTRAVENOUS | Status: DC
  Administered 2017-07-05 – 2017-07-06 (×4): 3.375 g via INTRAVENOUS
  Filled 2017-07-05 (×4): qty 50

## 2017-07-05 MED ORDER — PROPOFOL 10 MG/ML IV BOLUS
INTRAVENOUS | Status: DC | PRN
  Administered 2017-07-05: 150 mg via INTRAVENOUS

## 2017-07-05 MED ORDER — ROCURONIUM BROMIDE 50 MG/5ML IV SOLN
INTRAVENOUS | Status: AC
  Filled 2017-07-05: qty 1

## 2017-07-05 MED ORDER — HYDROCODONE-ACETAMINOPHEN 5-325 MG PO TABS
1.0000 | ORAL_TABLET | ORAL | Status: DC | PRN
  Administered 2017-07-05: 1 via ORAL
  Administered 2017-07-06 – 2017-07-07 (×4): 2 via ORAL
  Filled 2017-07-05 (×2): qty 2
  Filled 2017-07-05: qty 1
  Filled 2017-07-05 (×2): qty 2

## 2017-07-05 MED ORDER — FENTANYL CITRATE (PF) 100 MCG/2ML IJ SOLN
25.0000 ug | INTRAMUSCULAR | Status: DC | PRN
  Administered 2017-07-05 (×4): 25 ug via INTRAVENOUS

## 2017-07-05 MED ORDER — ROCURONIUM BROMIDE 100 MG/10ML IV SOLN
INTRAVENOUS | Status: DC | PRN
  Administered 2017-07-05: 20 mg via INTRAVENOUS
  Administered 2017-07-05: 10 mg via INTRAVENOUS

## 2017-07-05 MED ORDER — SODIUM CHLORIDE FLUSH 0.9 % IV SOLN
INTRAVENOUS | Status: AC
  Filled 2017-07-05: qty 10

## 2017-07-05 MED ORDER — SUCCINYLCHOLINE CHLORIDE 20 MG/ML IJ SOLN
INTRAMUSCULAR | Status: AC
  Filled 2017-07-05: qty 1

## 2017-07-05 MED ORDER — ACETAMINOPHEN 500 MG PO TABS: 1000.0000 mg | ORAL_TABLET | Freq: Four times a day (QID) | ORAL | Status: DC | PRN

## 2017-07-05 MED ORDER — PROPOFOL 10 MG/ML IV BOLUS
INTRAVENOUS | Status: AC
  Filled 2017-07-05: qty 20

## 2017-07-05 MED ORDER — ONDANSETRON HCL 4 MG/2ML IJ SOLN
INTRAMUSCULAR | Status: DC | PRN
  Administered 2017-07-05: 4 mg via INTRAVENOUS

## 2017-07-05 MED ORDER — LIDOCAINE HCL (CARDIAC) 20 MG/ML IV SOLN
INTRAVENOUS | Status: DC | PRN
  Administered 2017-07-05: 100 mg via INTRAVENOUS

## 2017-07-05 MED ORDER — FENTANYL CITRATE (PF) 100 MCG/2ML IJ SOLN
INTRAMUSCULAR | Status: AC
  Filled 2017-07-05: qty 2

## 2017-07-05 MED ORDER — MEPERIDINE HCL 50 MG/ML IJ SOLN: 6.2500 mg | INTRAMUSCULAR | Status: DC | PRN

## 2017-07-05 MED ORDER — PROMETHAZINE HCL 25 MG/ML IJ SOLN
INTRAMUSCULAR | Status: AC
  Filled 2017-07-05: qty 1

## 2017-07-05 MED ORDER — HYDROMORPHONE HCL 1 MG/ML IJ SOLN
0.5000 mg | INTRAMUSCULAR | Status: DC | PRN
Start: 2017-07-05 — End: 2017-07-07

## 2017-07-05 SURGICAL SUPPLY — 23 items
BNDG GAUZE 4.5X4.1 6PLY STRL (MISCELLANEOUS) IMPLANT
BRIEF STRETCH MATERNITY 2XLG (MISCELLANEOUS) ×3 IMPLANT
CHLORAPREP W/TINT 26ML (MISCELLANEOUS) ×3 IMPLANT
DRAIN PENROSE 1/4X12 LTX (DRAIN) ×9 IMPLANT
ELECT REM PT RETURN 9FT ADLT (ELECTROSURGICAL) ×3
ELECTRODE REM PT RTRN 9FT ADLT (ELECTROSURGICAL) ×1 IMPLANT
GAUZE SPONGE 4X4 12PLY STRL (GAUZE/BANDAGES/DRESSINGS) IMPLANT
GLOVE SURG SYN 7.0 (GLOVE) IMPLANT
GLOVE SURG SYN 7.5  E (GLOVE) ×4
GLOVE SURG SYN 7.5 E (GLOVE) ×2 IMPLANT
GOWN STRL REUS W/ TWL LRG LVL3 (GOWN DISPOSABLE) ×2 IMPLANT
GOWN STRL REUS W/TWL LRG LVL3 (GOWN DISPOSABLE) ×4
HANDLE YANKAUER SUCT BULB TIP (MISCELLANEOUS) ×3 IMPLANT
KIT TURNOVER KIT A (KITS) ×3 IMPLANT
LABEL OR SOLS (LABEL) IMPLANT
NS IRRIG 500ML POUR BTL (IV SOLUTION) ×3 IMPLANT
PACK BASIN MINOR ARMC (MISCELLANEOUS) ×3 IMPLANT
PAD ABD DERMACEA PRESS 5X9 (GAUZE/BANDAGES/DRESSINGS) ×6 IMPLANT
SURGILUBE 2OZ TUBE FLIPTOP (MISCELLANEOUS) ×3 IMPLANT
SUT SILK 0 (SUTURE) ×2
SUT SILK 0 30XBRD TIE 6 (SUTURE) ×1 IMPLANT
SWAB CULTURE AMIES ANAERIB BLU (MISCELLANEOUS) ×3 IMPLANT
abdominal pad ×6 IMPLANT

## 2017-07-05 NOTE — Progress Notes (Addendum)
Pharmacy Electrolyte Monitoring Consult:  Pharmacy consulted to assist in monitoring and replacing electrolytes in this 77 y.o. female admitted on 07/02/2017 with Abscess  Patient received potassium 20mEq PO x 1 and potassium 10mEq IV Q1hr x 3 doses on 3/19.  Labs:  Sodium (mmol/L)  Date Value  07/05/2017 140   Potassium (mmol/L)  Date Value  07/05/2017 3.3 (L)   Magnesium (mg/dL)  Date Value  16/10/960403/20/2019 2.2   Calcium (mg/dL)  Date Value  54/09/811903/21/2019 7.8 (L)    Plan:  3/21 K improved from 3 to 3.2 after Klor-Con 40 mEq po x 1. Magnesium was normal yesterday. Patient is now NPO for procedure. Will give potassium chloride 10 mEq IV Q1H x 4 doses and recheck electrolytes tomorrow with AM labs.   Pharmacy will continue to monitor and adjust per consult.   Rebecca FrostNathan A Carsen Ball, Pharm.D., BCPS Clinical Pharmacist 07/05/2017 7:37 AM

## 2017-07-05 NOTE — Progress Notes (Signed)
Inpatient Diabetes Program Recommendations  AACE/ADA: New Consensus Statement on Inpatient Glycemic Control (2015)  Target Ranges:  Prepandial:   less than 140 mg/dL      Peak postprandial:   less than 180 mg/dL (1-2 hours)      Critically ill patients:  140 - 180 mg/dL   Lab Results  Component Value Date   GLUCAP 236 (H) 07/05/2017   HGBA1C 6.8 (H) 07/03/2017    Review of Glycemic ControlResults for Rebecca PeersWALKER, Rebecca Ball (MRN [id number]) as of 07/05/2017 11:55  Ref. Range 07/04/2017 07:31 07/04/2017 11:41 07/04/2017 16:33 07/04/2017 21:09 07/05/2017 07:56  Glucose-Capillary Latest Ref Range: 65 - 99 mg/dL 409166 (H) 811249 (H) 914270 (H) 197 (H) 236 (H)   Diabetes history: Type 2 DM Outpatient Diabetes medications: Metformin 1000 mg bid, Novolog tid with meals Current orders for Inpatient glycemic control:  Novolog sensitive tid with meals and HS, Lantus 10 units daily  Inpatient Diabetes Program Recommendations:   Note patient NPO for surgery today.  Consider slight increase in Lantus to 14 units daily.    Thanks, Beryl MeagerJenny Kaspar Albornoz, RN, BC-ADM Inpatient Diabetes Coordinator Pager [phone]

## 2017-07-05 NOTE — Brief Op Note (Signed)
07/05/2017  8:28 PM  PATIENT:  Rebecca PeersJustina R Ball  77 y.o. female  PRE-OPERATIVE DIAGNOSIS:  Right gluteal and perirectal abscess  POST-OPERATIVE DIAGNOSIS:  same as preop  PROCEDURE:  Procedure(s): IRRIGATION AND DEBRIDEMENT GLUTEAL AND PERIRECTAL ABSCESS (Right)  SURGEON:  Surgeon(s) and Role:    * Wilmary Levit, MD - Primary  ANESTHESIA:   general  EBL: 30   BLOOD ADMINISTERED:none  DRAINS: Penrose drain in the Penrose drain x 3   LOCAL MEDICATIONS USED:  NONE  SPECIMEN:  Source of Specimen:  gluteal and perirectal abscess and necrotic tissue  DISPOSITION OF SPECIMEN:  PATHOLOGY  COUNTS:  YES  TOURNIQUET:  * No tourniquets in log *  DICTATION: .Dragon Dictation  PLAN OF CARE: Admit to inpatient   PATIENT DISPOSITION:  PACU - hemodynamically stable.   Delay start of Pharmacological VTE agent (>24hrs) due to surgical blood loss or risk of bleeding: yes

## 2017-07-05 NOTE — Transfer of Care (Signed)
Immediate Anesthesia Transfer of Care Note  Patient: Rebecca Ball  Procedure(s) Performed: IRRIGATION AND DEBRIDEMENT GLUTEAL ABSCESS (Right )  Patient Location: PACU  Anesthesia Type:General  Level of Consciousness: awake  Airway & Oxygen Therapy: Patient connected to face mask oxygen  Post-op Assessment: Post -op Vital signs reviewed and stable  Post vital signs: stable  Last Vitals:  Vitals Value Taken Time  BP 126/60 07/05/2017  8:27 PM  Temp    Pulse 93 07/05/2017  8:25 PM  Resp 17 07/05/2017  8:27 PM  SpO2 95 % 07/05/2017  8:25 PM  Vitals shown include unvalidated device data.  Last Pain:  Vitals:   07/05/17 1522  TempSrc:   PainSc: 4          Complications: No apparent anesthesia complications

## 2017-07-05 NOTE — Anesthesia Procedure Notes (Signed)
Procedure Name: Intubation Date/Time: 07/05/2017 7:00 PM Performed by: Irving BurtonBachich, Dalyla Chui, CRNA Pre-anesthesia Checklist: Patient identified, Patient being monitored, Timeout performed, Emergency Drugs available and Suction available Patient Re-evaluated:Patient Re-evaluated prior to induction Oxygen Delivery Method: Circle system utilized Preoxygenation: Pre-oxygenation with 100% oxygen Induction Type: IV induction Ventilation: Mask ventilation without difficulty Laryngoscope Size: 3 and McGraph Grade View: Grade II Tube type: Oral Tube size: 7.0 mm Number of attempts: 1 Airway Equipment and Method: Stylet Placement Confirmation: ETT inserted through vocal cords under direct vision,  positive ETCO2 and breath sounds checked- equal and bilateral Secured at: 21 cm Tube secured with: Tape Dental Injury: Teeth and Oropharynx as per pre-operative assessment

## 2017-07-05 NOTE — Progress Notes (Signed)
[employer] - [place] at [facility]   PATIENT NAME: Rebecca Ball    MR#:  [id number]  DATE OF BIRTH:  Sep 25, 1940  SUBJECTIVE:  Discomfort and buttock area.  Patient is going for I&D of buttock abscess by surgery later today.  REVIEW OF SYSTEMS:    Review of Systems  Constitutional: Negative for fever, chills weight loss HENT: Negative for ear pain, nosebleeds, congestion, facial swelling, rhinorrhea, neck pain, neck stiffness and ear discharge.   Respiratory: Negative for cough, shortness of breath, wheezing  Cardiovascular: Negative for chest pain, palpitations and leg swelling.  Gastrointestinal: Negative for heartburn, abdominal pain, vomiting, diarrhea or consitpation Genitourinary: Negative for dysuria, urgency, frequency, hematuria Musculoskeletal: Negative for back pain or joint pain, and has swelling, redness, pain in right buttock area. Neurological: Negative for dizziness, seizures, syncope, focal weakness,  numbness and headaches.  Hematological: Does not bruise/bleed easily.  Psychiatric/Behavioral: Negative for hallucinations, confusion, dysphoric mood    Tolerating Diet: npo     DRUG ALLERGIES:   Allergies  Allergen Reactions  . Penicillins Anaphylaxis  . Oxycodone Nausea And Vomiting  . Propoxyphene Nausea And Vomiting    VITALS:  Blood pressure (!) 133/47, pulse 83, temperature 98.4 F (36.9 C), temperature source Oral, resp. rate 19, height 5\' 4"  (1.626 m), weight 94.2 kg (207 lb 10.8 oz), SpO2 95 %.  PHYSICAL EXAMINATION:  Constitutional: Appears well-developed and well-nourished. No distress. HENT: Normocephalic. Marland Kitchen Oropharynx is clear and moist.  Eyes: Conjunctivae and EOM are normal. PERRLA, no scleral icterus.  Neck: Normal ROM. Neck supple. No JVD. No tracheal deviation. CVS: RRR, S1/S2 +, no murmurs, no gallops, no carotid bruit.  Pulmonary: Effort and breath sounds normal, no stridor, rhonchi, wheezes, rales.  Abdominal:  Soft. BS +,  no distension, tenderness, rebound or guarding.  Musculoskeletal: Normal range of motion. No edema and no tenderness.  Neuro: Alert. CN 2-12 grossly intact. No focal deficits. Skin: right buttock abscess, patient has a very hard indurated area in the right buttock. Psychiatric: Normal mood and affect.      LABORATORY PANEL:   CBC Recent Labs  Lab 07/05/17 0500  WBC 10.5  HGB 9.8*  HCT 30.8*  PLT 350   ------------------------------------------------------------------------------------------------------------------  Chemistries  Recent Labs  Lab 07/04/17 0424 07/05/17 0500  NA 135 140  K 3.0* 3.3*  CL 104 109  CO2 19* 19*  GLUCOSE 195* 246*  BUN 55* 44*  CREATININE 1.84* 1.43*  CALCIUM 7.8* 7.8*  MG 2.2  --    ------------------------------------------------------------------------------------------------------------------  Cardiac Enzymes No results for input(s): TROPONINI in the last 168 hours. ------------------------------------------------------------------------------------------------------------------  RADIOLOGY:  No results found.   ASSESSMENT AND PLAN:   77 year old female with a history of diabetes who presents due to right buttock abscess.  1. Sepsis: Patient presents with fever, tachycardia and elevated white blood cell count. Sepsis is due to right buttock abscess.,  Clinically improving, continue Zosyn.  Patient WBC is normalized.  Pro-calcitonin level also is decreasing.   2. Right buttock abscess: Continue Zosyn.  Patient is n.p.o. for right buttock abscess drainage by surgery.  I spoke with Dr. Aleen Campi.  Appreciate surgery and ID evaluation  3. Acute kidney injury in the setting of poor by mouth intake and ATN with sepsis Creatinine improved with IV fluids, continue IV fluids because patient is n.p.o. Appreciate nephrology consultation Hold nephrotoxic medications  4. Electrolyte abnormalities including low potassium and  magnesium: Potassium improved but still low.  Continue to replace electrolytes and IV  fluids.  Magnesium improved.  5.  DKA: This has resolved Continue sliding scale  Continue Lantus May not need Lantus at discharge as per diabetes nurse.  6. Essential hypertension: Continue metoprolol  Discussed with DR.Piscoya.  Management plans discussed with the patient and she is in agreement.  CODE STATUS: full  TOTAL TIME TAKING CARE OF THIS PATIENT: 24 minutes.     POSSIBLE D/C 2 days, DEPENDING ON CLINICAL CONDITION.   Katha HammingSnehalatha Datrell Dunton M.D on 07/05/2017 at 9:51 AM  Between 7am to 6pm - Pager - [phone] After 6pm go to [email or web address] - password [email or web address]  [facility] [place] [facility]  Office  [phone]  CC: Primary care physician; Leotis ShamesSingh, Jasmine, MD  Note: This dictation was prepared with Dragon dictation along with smaller phrase technology. Any transcriptional errors that result from this process are unintentional.

## 2017-07-05 NOTE — Progress Notes (Signed)
07/05/2017  Subjective: Patient reports she needed pain medication to be comfortable last night.  Still having pain in the right buttocks area.  No fevers or chills.  Vital signs: Temp:  [98.4 F (36.9 C)-98.7 F (37.1 C)] 98.4 F (36.9 C) (03/21 0302) Pulse Rate:  [75-87] 83 (03/21 0302) Resp:  [18-19] 19 (03/21 0302) BP: (105-133)/(42-47) 133/47 (03/21 0302) SpO2:  [95 %-98 %] 95 % (03/21 0302)   Intake/Output: 03/20 0701 - 03/21 0700 In: 1468 [P.O.:640; I.V.:778; IV Piggyback:50] Out: 200 [Urine:200] Last BM Date: 07/04/17  Physical Exam: Constitutional: No acute distress Skin:  Right perianal abscess with two areas that had spontaneously drained, with fibrinous necrotic tissue around in the wound base.  There is significant induration extending to the posterior superior right buttocks, and another area of fluctuance in the anterior gluteal area.    Labs:  Recent Labs    07/04/17 0424 07/05/17 0500  WBC 16.5* 10.5  HGB 10.6* 9.8*  HCT 33.3* 30.8*  PLT 347 350   Recent Labs    07/04/17 0424 07/05/17 0500  NA 135 140  K 3.0* 3.3*  CL 104 109  CO2 19* 19*  GLUCOSE 195* 246*  BUN 55* 44*  CREATININE 1.84* 1.43*  CALCIUM 7.8* 7.8*   No results for input(s): LABPROT, INR in the last 72 hours.  Imaging: No results found.  Assessment/Plan: 77 yo female with right perianal / gluteal abscess  --will take patient to OR today for I&D of perianal abscess.  Discussed with patient the risks of bleeding, infection, and injury to surrounding structures and she's willing to proceed.  Discussed that she would likely have penrose drains in place post-op --keep NPO today with IV fluid and continue IV antibiotics.   Howie IllJose Luis Slade [place], MD [facility]

## 2017-07-05 NOTE — Anesthesia Post-op Follow-up Note (Signed)
Anesthesia QCDR form completed.        

## 2017-07-05 NOTE — Progress Notes (Signed)
[facility] CLINIC INFECTIOUS DISEASE PROGRESS NOTE Date of Admission:  07/02/2017     ID: Rebecca Ball is a 77 y.o. female with   DKA and buttock abscess. Active Problems:   Cellulitis and abscess of buttock   DKA (diabetic ketoacidoses) (HCC)   Acute renal failure (HCC)   Subjective: For surgery today   ROS  Eleven systems are reviewed and negative except per hpi  Medications:  Antibiotics Given (last 72 hours)    Date/Time Action Medication Dose Rate   07/02/17 2055 New Bag/Given   vancomycin (VANCOCIN) IVPB 1000 mg/200 mL premix 1,000 mg 200 mL/hr   07/03/17 0203 New Bag/Given   piperacillin-tazobactam (ZOSYN) IVPB 3.375 g 3.375 g 12.5 mL/hr   07/03/17 1334 New Bag/Given   piperacillin-tazobactam (ZOSYN) IVPB 3.375 g 3.375 g 12.5 mL/hr   07/03/17 1454 New Bag/Given   vancomycin (VANCOCIN) IVPB 750 mg/150 ml premix 750 mg 150 mL/hr   07/04/17 0123 New Bag/Given  [had to reset computer]   piperacillin-tazobactam (ZOSYN) IVPB 3.375 g 3.375 g 12.5 mL/hr   07/04/17 1335 New Bag/Given   piperacillin-tazobactam (ZOSYN) IVPB 3.375 g 3.375 g 12.5 mL/hr   07/04/17 1528 New Bag/Given   vancomycin (VANCOCIN) IVPB 750 mg/150 ml premix 750 mg 150 mL/hr   07/05/17 0100 New Bag/Given   piperacillin-tazobactam (ZOSYN) IVPB 3.375 g 3.375 g 12.5 mL/hr     . aspirin  81 mg Oral Daily  . heparin  5,000 Units Subcutaneous Q8H  . insulin aspart  0-5 Units Subcutaneous QHS  . insulin aspart  0-9 Units Subcutaneous TID WC  . insulin glargine  10 Units Subcutaneous Daily  . metoprolol succinate  25 mg Oral Daily  . multivitamin with minerals  1 tablet Oral Daily  . pneumococcal 23 valent vaccine  0.5 mL Intramuscular Tomorrow-1000  . protein supplement shake  11 oz Oral BID BM  . simvastatin  20 mg Oral Daily    Objective: Vital signs in last 24 hours: Temp:  [98.4 F (36.9 C)-98.7 F (37.1 C)] 98.4 F (36.9 C) (03/21 0302) Pulse Rate:  [79-87] 79 (03/21 1024) Resp:  [18-19] 19  (03/21 0302) BP: (116-133)/(42-48) 133/48 (03/21 1024) SpO2:  [95 %-98 %] 95 % (03/21 0302) Constitutional:  oriented to person, place, and time. appears well-developed and well-nourished. No distress.  HENT: [place]/AT, PERRLA, no scleral icterus Mouth/Throat: Oropharynx is clear and moist. No oropharyngeal exudate.  Cardiovascular: Normal rate, regular rhythm and normal heart sounds. Exam reveals no gallop and no friction rub.  No murmur heard.  Pulmonary/Chest: Effort normal and breath sounds normal. No respiratory distress.  has no wheezes.  Neck = supple, no nuchal rigidity Abdominal: Soft. Bowel sounds are normal.  exhibits no distension. There is no tenderness.  Lymphadenopathy: no cervical adenopathy. No axillary adenopathy Neurological: alert and oriented to person, place, and time.  Skin: draining gluteal abscess on the patient's right side with marked induration - I am able to express thick brown tan  fluid  Psychiatric: a normal mood and affect.  behavior is normal.     Lab Results Recent Labs    07/04/17 0424 07/05/17 0500  WBC 16.5* 10.5  HGB 10.6* 9.8*  HCT 33.3* 30.8*  NA 135 140  K 3.0* 3.3*  CL 104 109  CO2 19* 19*  BUN 55* 44*  CREATININE 1.84* 1.43*    Microbiology: Results for orders placed or performed during the hospital encounter of 07/02/17  Culture, blood (routine x 2)  Status: None (Preliminary result)   Collection Time: 07/02/17  6:13 PM  Result Value Ref Range Status   Specimen Description BLOOD LEFT ASSIST CONTROL  Final   Special Requests   Final    BOTTLES DRAWN AEROBIC AND ANAEROBIC Blood Culture adequate volume   Culture   Final    NO GROWTH 3 DAYS Performed at [facility], [address] [place], [place] [address]    Report Status PENDING  Incomplete  Culture, blood (routine x 2)     Status: None (Preliminary result)   Collection Time: 07/02/17  6:13 PM  Result Value Ref Range Status   Specimen Description BLOOD RIGHT  ASSIST CONTROL  Final   Special Requests   Final    BLOOD Blood Culture results may not be optimal due to an excessive volume of blood received in culture bottles   Culture   Final    NO GROWTH 3 DAYS Performed at [facility], [address] [place], [place] [address]    Report Status PENDING  Incomplete  Wound or Superficial Culture     Status: Abnormal   Collection Time: 07/02/17  6:13 PM  Result Value Ref Range Status   Specimen Description   Final    BUTTOCKS Performed at [facility], [address] [place], [place] [address]    Special Requests   Final    Normal Performed at [facility], [address] [place], [place] [address]    Gram Stain   Final    NO WBC SEEN NO SQUAMOUS EPITHELIAL CELLS SEEN ABUNDANT GRAM NEGATIVE RODS MODERATE GRAM POSITIVE COCCI IN CLUSTERS MODERATE GRAM POSITIVE RODS Performed at [facility], [address] [place], [place] [address]    Culture MULTIPLE ORGANISMS PRESENT, NONE PREDOMINANT (A)  Final   Report Status 07/04/2017 FINAL  Final  MRSA PCR Screening     Status: None   Collection Time: 07/03/17  1:05 AM  Result Value Ref Range Status   MRSA by PCR NEGATIVE NEGATIVE Final    Comment:        The GeneXpert MRSA Assay (FDA approved for NASAL specimens only), is one component of a comprehensive MRSA colonization surveillance program. It is not intended to diagnose MRSA infection nor to guide or monitor treatment for MRSA infections. Performed at [facility], [address] [place], [place] [address]     Studies/Results: No results found.  Assessment/Plan: Rebecca Ball is a 77 y.o. female with DM (A1c 7.5 in Nov 2018) admitted with R buttock abscess now self draining and DKA.  Gram stain and culture aremixed. On vanco and zosyn. Clinically improving.  MRSA PCR negative. No evidence MRSA at this point so can dc vanco esp with AOCKD.  For surgery today  Recommendations Dc  vanco Cont  zosyn Fu after debridement today   Thank you very much for the consult. Will follow with you.  Mick SellDavid P Zoee Heeney   07/05/2017, 11:31 AM

## 2017-07-05 NOTE — Anesthesia Preprocedure Evaluation (Signed)
Anesthesia Evaluation  Patient identified by MRN, date of birth, ID band Patient awake    Reviewed: Allergy & Precautions, NPO status , Patient's Chart, lab work & pertinent test results  History of Anesthesia Complications (+) PONV and history of anesthetic complications  Airway Mallampati: III  TM Distance: >3 FB Neck ROM: Full    Dental no notable dental hx.    Pulmonary neg pulmonary ROS, neg sleep apnea, neg COPD,    breath sounds clear to auscultation- rhonchi (-) wheezing      Cardiovascular hypertension, Pt. on medications (-) CAD, (-) Past MI, (-) Cardiac Stents and (-) CABG  Rhythm:Regular Rate:Normal - Systolic murmurs and - Diastolic murmurs    Neuro/Psych negative neurological ROS  negative psych ROS   GI/Hepatic negative GI ROS, Neg liver ROS,   Endo/Other  diabetes, Insulin Dependent  Renal/GU Renal InsufficiencyRenal disease     Musculoskeletal negative musculoskeletal ROS (+)   Abdominal (+) + obese,   Peds  Hematology negative hematology ROS (+)   Anesthesia Other Findings Past Medical History: No date: Diabetes mellitus without complication (HCC) No date: Hypertension   Reproductive/Obstetrics                             Anesthesia Physical Anesthesia Plan  ASA: III  Anesthesia Plan: General   Post-op Pain Management:    Induction: Intravenous  PONV Risk Score and Plan: 3 and Propofol infusion, Ondansetron and Midazolam  Airway Management Planned: Oral ETT  Additional Equipment:   Intra-op Plan:   Post-operative Plan: Extubation in OR  Informed Consent: I have reviewed the patients History and Physical, chart, labs and discussed the procedure including the risks, benefits and alternatives for the proposed anesthesia with the patient or authorized representative who has indicated his/her understanding and acceptance.   Dental advisory given  Plan  Discussed with: CRNA and Anesthesiologist  Anesthesia Plan Comments:         Anesthesia Quick Evaluation

## 2017-07-06 ENCOUNTER — Encounter: Payer: Self-pay | Admitting: Surgery

## 2017-07-06 LAB — BASIC METABOLIC PANEL
Anion gap: 12 (ref 5–15)
BUN: 38 mg/dL — ABNORMAL HIGH (ref 6–20)
CALCIUM: 7.7 mg/dL — AB (ref 8.9–10.3)
CO2: 16 mmol/L — ABNORMAL LOW (ref 22–32)
Chloride: 112 mmol/L — ABNORMAL HIGH (ref 101–111)
Creatinine, Ser: 1.07 mg/dL — ABNORMAL HIGH (ref 0.44–1.00)
GFR calc Af Amer: 57 mL/min — ABNORMAL LOW (ref >60)
GFR, EST NON AFRICAN AMERICAN: 49 mL/min — AB (ref >60)
Glucose, Bld: 324 mg/dL — ABNORMAL HIGH (ref 65–99)
Potassium: 4 mmol/L (ref 3.5–5.1)
Sodium: 140 mmol/L (ref 135–145)

## 2017-07-06 LAB — GLUCOSE, CAPILLARY
GLUCOSE-CAPILLARY: 329 mg/dL — AB (ref 65–99)
GLUCOSE-CAPILLARY: 289 mg/dL — AB (ref 65–99)
Glucose-Capillary: 287 mg/dL — ABNORMAL HIGH (ref 65–99)
Glucose-Capillary: 325 mg/dL — ABNORMAL HIGH (ref 65–99)

## 2017-07-06 LAB — C DIFFICILE QUICK SCREEN W PCR REFLEX
C DIFFICILE (CDIFF) TOXIN: NEGATIVE
C DIFFICLE (CDIFF) ANTIGEN: NEGATIVE
C Diff interpretation: NOT DETECTED

## 2017-07-06 LAB — MAGNESIUM: MAGNESIUM: 1.7 mg/dL (ref 1.7–2.4)

## 2017-07-06 MED ORDER — MAGNESIUM SULFATE 2 GM/50ML IV SOLN
2.0000 g | Freq: Once | INTRAVENOUS | Status: AC
  Administered 2017-07-06: 2 g via INTRAVENOUS
  Filled 2017-07-06: qty 50

## 2017-07-06 MED ORDER — MAGNESIUM SULFATE 2 GM/50ML IV SOLN
2.0000 g | Freq: Once | INTRAVENOUS | Status: DC
  Filled 2017-07-06: qty 50

## 2017-07-06 MED ORDER — PANTOPRAZOLE SODIUM 40 MG IV SOLR
40.0000 mg | Freq: Two times a day (BID) | INTRAVENOUS | Status: DC
  Administered 2017-07-06 – 2017-07-07 (×3): 40 mg via INTRAVENOUS
  Filled 2017-07-06 (×3): qty 40

## 2017-07-06 MED ORDER — INSULIN GLARGINE 100 UNIT/ML ~~LOC~~ SOLN
16.0000 [IU] | Freq: Every day | SUBCUTANEOUS | Status: DC
  Filled 2017-07-06: qty 0.16

## 2017-07-06 MED ORDER — AMPICILLIN-SULBACTAM SODIUM 3 (2-1) G IJ SOLR
3.0000 g | Freq: Four times a day (QID) | INTRAMUSCULAR | Status: DC
  Administered 2017-07-06 – 2017-07-07 (×4): 3 g via INTRAVENOUS
  Filled 2017-07-06 (×8): qty 3

## 2017-07-06 NOTE — Care Management Important Message (Signed)
Important Message  Patient Details  Name: Robyne PeersJustina R Gradilla MRN: [id number] Date of Birth: 27-May-1940   Medicare Important Message Given:  Yes    Chapman FitchBOWEN, Mayce Noyes T, RN 07/06/2017, 11:31 AM

## 2017-07-06 NOTE — Progress Notes (Signed)
Inpatient Diabetes Program Recommendations  AACE/ADA: New Consensus Statement on Inpatient Glycemic Control (2015)  Target Ranges:  Prepandial:   less than 140 mg/dL      Peak postprandial:   less than 180 mg/dL (1-2 hours)      Critically ill patients:  140 - 180 mg/dL   Lab Results  Component Value Date   GLUCAP 329 (H) 07/06/2017   HGBA1C 6.8 (H) 07/03/2017    Review of Glycemic ControlResults for Robyne PeersWALKER, Kaelin R (MRN [id number]) as of 07/06/2017 14:24  Ref. Range 07/05/2017 16:35 07/05/2017 20:52 07/05/2017 21:37 07/06/2017 07:28 07/06/2017 11:49  Glucose-Capillary Latest Ref Range: 65 - 99 mg/dL 409238 (H) 811216 (H) 914199 (H) 287 (H) 329 (H)    Diabetes history: Type 2 DM Outpatient Diabetes medications: Lantus 75 units q AM, Metformin 1000 mg bid Current orders for Inpatient glycemic control:  Novolog sensitive tid with meals and HS, Lantus 16 units daily  Inpatient Diabetes Program Recommendations:    Spoke with patient regarding home DM medications for diabetes.  She states that she takes a total of 75 units of Lantus daily.  This was not on home medication reconciliation so it was added. She does say that often her fasting blood sugars run between 45-55 mg/dL and that her dr. Recommended that she take her evening metformin earlier.  We discussed that often it is the Lantus that will cause low fasting blood sugars and that she likely needs a reduction in Lantus in order to prevent hypoglycemia in the mornings.  She states "it makes me feel so bad when my blood sugars are low".  We discussed treatment of low blood sugars as well.  Note that blood sugars have increased since patient was admitted to the hospital.    May consider increasing Lantus to 30 units daily (this is less than 1/2 of home dose).  Home dose of Lantus will need to be reduced at d/c as well due to patients reports of low fasting blood sugars.  Patient appreciative of information.  Will text page MD.   Thanks, Beryl MeagerJenny  Eugen Jeansonne, RN, BC-ADM Inpatient Diabetes Coordinator Pager [phone]

## 2017-07-06 NOTE — Anesthesia Postprocedure Evaluation (Signed)
Anesthesia Post Note  Patient: Rebecca PeersJustina R Pizzo  Procedure(s) Performed: IRRIGATION AND DEBRIDEMENT GLUTEAL ABSCESS (Right )  Patient location during evaluation: PACU Anesthesia Type: General Level of consciousness: awake and alert and oriented Pain management: pain level controlled Vital Signs Assessment: post-procedure vital signs reviewed and stable Respiratory status: spontaneous breathing, nonlabored ventilation and respiratory function stable Cardiovascular status: blood pressure returned to baseline and stable Postop Assessment: no signs of nausea or vomiting Anesthetic complications: no     Last Vitals:  Vitals Value Taken Time  BP    Temp    Pulse    Resp    SpO2      Last Pain:  Vitals:   07/05/17 2239  TempSrc: Oral  PainSc:                  Clorine Swing

## 2017-07-06 NOTE — Progress Notes (Signed)
SURGICAL PROGRESS NOTE  Hospital Day(s): 4.   Post op day(s): 1 Day Post-Op.   Interval History: Patient seen and examined, no acute events or new complaints overnight. Patient reports her buttock is "sore", but she specifies it is "4 out of 10" and "better than before surgery". She otherwise denies fever/chills, N/V, CP, or SOB. Her primary complaint is removing even paper tape from her skin "feels like a blowtorch".  Review of Systems:  Constitutional: denies fever, chills  HEENT: denies cough or congestion  Respiratory: denies any shortness of breath  Cardiovascular: denies chest pain or palpitations  Gastrointestinal: denies abdominal pain, N/V, or diarrhea Genitourinary: denies burning with urination or urinary frequency Musculoskeletal: denies pain, decreased motor or sensation Integumentary: denies any other rashes or skin discolorations except as per HPI Neurological: denies HA or vision/hearing changes   Vital signs in last 24 hours: [min-max] current  Temp:  [97.7 F (36.5 C)-98.7 F (37.1 C)] 98.1 F (36.7 C) (03/22 0328) Pulse Rate:  [71-93] 77 (03/22 0328) Resp:  [13-23] 19 (03/22 0328) BP: (97-174)/(42-99) 100/72 (03/22 0328) SpO2:  [76 %-100 %] 95 % (03/22 0328)     Height: 5\' 4"  (162.6 cm) Weight: 207 lb 10.8 oz (94.2 kg) BMI (Calculated): 35.63   Intake/Output this shift:  No intake/output data recorded.   Intake/Output last 2 shifts:  @IOLAST2SHIFTS @   Physical Exam:  Constitutional: alert, cooperative and no distress  HENT: normocephalic without obvious abnormality  Eyes: PERRL, EOM's grossly intact and symmetric  Neuro: CN II - XII grossly intact and symmetric without deficit  Respiratory: breathing non-labored at rest  Cardiovascular: regular rate and sinus rhythm  Gastrointestinal: soft, non-tender, and non-distended Musculoskeletal: UE and LE FROM, no edema or wounds, motor and sensation grossly intact, NT Integumentary: Penrose drains  well-secured with minimal non-purulent drainage on recently changed dressing and mild surrounding erythema, skin edges well-perfused, viable, and only mildly tender to palpation (more pain removing tape)  Labs:  CBC Latest Ref Rng & Units 07/05/2017 07/04/2017 07/03/2017  WBC 3.6 - 11.0 K/uL 10.5 16.5(H) 15.2(H)  Hemoglobin 12.0 - 16.0 g/dL 1.6(X) 10.6(L) 10.2(L)  Hematocrit 35.0 - 47.0 % 30.8(L) 33.3(L) 32.4(L)  Platelets 150 - 440 K/uL 350 347 256   CMP Latest Ref Rng & Units 07/06/2017 07/05/2017 07/04/2017  Glucose 65 - 99 mg/dL 096(E) 454(U) 981(X)  BUN 6 - 20 mg/dL 91(Y) 78(G) 95(A)  Creatinine 0.44 - 1.00 mg/dL 2.13(Y) 8.65(H) 8.46(N)  Sodium 135 - 145 mmol/L 140 140 135  Potassium 3.5 - 5.1 mmol/L 4.0 3.3(L) 3.0(L)  Chloride 101 - 111 mmol/L 112(H) 109 104  CO2 22 - 32 mmol/L 16(L) 19(L) 19(L)  Calcium 8.9 - 10.3 mg/dL 7.7(L) 7.8(L) 7.8(L)   Imaging studies: No new pertinent imaging studies   Assessment/Plan: 77 y.o. female doing overall well from a surgical perspective 1 Day Post-Op s/p incision and drainage of Right gluteal and perirectal abscess, complicated by resolved leukocytosis, resolving AKI, and by pertinent comorbidities including morbid obesity (BMI 36), DM, and HTN.   - pain control prn  - antibiotics as per primary medical team  - follow-up pending deep abscess cultures  - medical management of comorbidities as per primary medical team   - keep penrose drains, no packing to change, change dry gauze daily and prn  - outpatient surgical follow-up 1 week after discharge   All of the above findings and recommendations were discussed with the patient, patient's husband, and patient's RN, and all of patient's and  family's questions were answered to their expressed satisfaction.  Thank you for the opportunity to participate in this patient's care.  -- Scherrie GerlachJason E. Earlene Plateravis, MD, RPVI [place]: [facility] - Partnering for exceptional  care. Office: [phone]

## 2017-07-06 NOTE — Progress Notes (Signed)
[employer] [facility] - [place] [facility]   PATIENT NAME: Rebecca Ball    MR#:  [id number]  DATE OF BIRTH:  Sep 12, 1940  SUBJECTIVE:  Patient had I&D of buttock abscess yesterday and patient has 3 Penrose drains.  Tolerated the procedure well.  Has some nausea, diarrhea today.  Stool C. difficile has been sent.  Complains of pain in the buttock area.  REVIEW OF SYSTEMS:    Review of Systems  Constitutional: Negative for fever, chills weight loss HENT: Negative for ear pain, nosebleeds, congestion, facial swelling, rhinorrhea, neck pain, neck stiffness and ear discharge.   Respiratory: Negative for cough, shortness of breath, wheezing  Cardiovascular: Negative for chest pain, palpitations and leg swelling.  Gastrointestinal: Some nausea, diarrhea today  genitourinary: Negative for dysuria, urgency, frequency, hematuria Musculoskeletal: Has pain in the buttock area. Neurological: Negative for dizziness, seizures, syncope, focal weakness,  numbness and headaches.  Hematological: Does not bruise/bleed easily.  Psychiatric/Behavioral: Negative for hallucinations, confusion, dysphoric mood    Tolerating Diet: npo     DRUG ALLERGIES:   Allergies  Allergen Reactions  . Penicillins Anaphylaxis  . Oxycodone Nausea And Vomiting  . Propoxyphene Nausea And Vomiting    VITALS:  Blood pressure 100/72, pulse 77, temperature 97.9 F (36.6 C), temperature source Oral, resp. rate 19, height 5\' 4"  (1.626 m), weight 94.2 kg (207 lb 10.8 oz), SpO2 95 %.  PHYSICAL EXAMINATION:  Constitutional: Appears well-developed and well-nourished. No distress. HENT: Normocephalic. Marland Kitchen Oropharynx is clear and moist.  Eyes: Conjunctivae and EOM are normal. PERRLA, no scleral icterus.  Neck: Normal ROM. Neck supple. No JVD. No tracheal deviation. CVS: RRR, S1/S2 +, no murmurs, no gallops, no carotid bruit.  Pulmonary: Effort and breath sounds normal, no stridor, rhonchi, wheezes, rales.   Abdominal: Soft. BS +,  no distension, tenderness, rebound or guarding.  Musculoskeletal: Normal range of motion. No edema and no tenderness.  Neuro: Alert. CN 2-12 grossly intact. No focal deficits. Skin: Dressing present status post gluteal abscess drainage. Psychiatric: Normal mood and affect.      LABORATORY PANEL:   CBC Recent Labs  Lab 07/05/17 0500  WBC 10.5  HGB 9.8*  HCT 30.8*  PLT 350   ------------------------------------------------------------------------------------------------------------------  Chemistries  Recent Labs  Lab 07/06/17 0452  NA 140  K 4.0  CL 112*  CO2 16*  GLUCOSE 324*  BUN 38*  CREATININE 1.07*  CALCIUM 7.7*  MG 1.7   ------------------------------------------------------------------------------------------------------------------  Cardiac Enzymes No results for input(s): TROPONINI in the last 168 hours. ------------------------------------------------------------------------------------------------------------------  RADIOLOGY:  No results found.   ASSESSMENT AND PLAN:   77 year old female with a history of diabetes who presents due to right buttock abscess.  1. Sepsis: Patient presents with fever, tachycardia and elevated white blood cell count. Sepsis is due to right buttock abscess.,  Clinically improving, continue Zosyn.  Patient WBC is normalized.  Pro-calcitonin level also is decreasing. Comycin discontinued.  2. Right buttock abscess, perirectal abscess: Status post drainage, patient now has 3 Penrose drains.  Appreciate surgery following the patient.  Patient needs home health nursing when discharged.  Spoke with her about this.: Continue Zosyn.,  Intraoperative cultures showing gram-negative rods, gram-positive cocci, final cultures results are pending.  3. Acute kidney injury in the setting of poor by mouth intake and ATN with sepsis Creatinine improved with IV fluids, Appreciate nephrology consultation Hold  nephrotoxic medications Patient has poor p.o. intake secondary to nausea.  We will continue IV fluids but I decreased the rate.  4. Electrolyte abnormalities including low potassium and magnesium: Potassium improved but still low.  Continue to replace electrolytes and IV fluids.  Magnesium improved.  5.  DKA: This has resolved Both are uncontrolled because of sepsis, increase the low dose of Lantus 6. Essential hypertension: Continue metoprolol #7 diarrhea, nausea; likely viral GI infection: Stool C. difficile has been sent.  Use Zofran for nausea, add PPIs, discontinue stool softeners.   Management plans discussed with the patient and she is in agreement.  CODE STATUS: full  TOTAL TIME TAKING CARE OF THIS PATIENT: 24 minutes.     POSSIBLE D/C 2 days, DEPENDING ON CLINICAL CONDITION.   Awab Abebe M.D on 07/06/2017 Katha Hammingat 11:19 AM  Between 7am to 6pm - Pager - [phone] After 6pm go to [email or web address] - password [email or web address]  [facility]  Office  [phone]  CC: Primary care physician; Leotis ShamesSingh, Jasmine, MD  Note: This dictation was prepared with Dragon dictation along with smaller phrase technology. Any transcriptional errors that result from this process are unintentional.

## 2017-07-06 NOTE — Op Note (Signed)
  Procedure Date:  07/05/2017  Pre-operative Diagnosis:  Right perirectal and gluteal abscess  Post-operative Diagnosis:  Right perirectal and gluteal abscess  Procedure:  Incision and Drainage of right perirectal and gluteal abscess  Surgeon:  Howie IllJose Luis Diron Haddon, MD  Anesthesia:  General endotracheal  Estimated Blood Loss:  30 ml  Specimens:  Culture swabs and necrotic tissue  Complications:  None  Indications for Procedure:  This is a 77 y.o. female with diagnosis of right perirectal and gluteal abscess, requiring drainage procedure.  The risks of bleeding, abscess or infection, injury to surrounding structures, and need for further procedures were all discussed with the patient and was willing to proceed.  Description of Procedure: The patient was correctly identified in the preoperative area and brought into the operating room.  The patient was placed supine with VTE prophylaxis in place.  Appropriate time-outs were performed.  Anesthesia was induced and the patient was intubated.  Appropriate antibiotics were infused.  The patient was placed in right lateral decubitus position.  Tape was used to retract the patient's left buttocks for better exposure.  The patient's perianal and gluteal area was prepped and draped in usual sterile fashion.  There was an area of open wound with necrotic tissue and purulent drainage measuring about 6 cm by 3 cm, extending to the anal verge.  All necrotic tissue was sharply debrided using scalpel.  Culture swab was sent of the purulent fluid along with the necrotic tissue.  A digital rectal exam revealed an intact sphincter muscle and no transmural extension of the abscess.  There was an area of fluctuance anterior to the wound and a 2 cm incision was made over it revealing again some necrotic and purulent fluid.  This was also sharply debrided.  Yankauer was used to suction all fluid and track the abscess cavity.  The cavity was going posterior toward the  superior right buttocks, consistent with erythema and induration noted preop.  Three counter incisions were made along the posterior right buttocks along the extension of the cavity and more purulent fluid was suctioned.  No visible necrotic tissue was noted. The entire cavity was thoroughly irrigated using 2 liters of saline. Hemostasis was achieved using cautery. Penrose drains x 3 were placed and looped between incisions and ligated using 0 Silk ties.  The skin was then cleaned off and two ABD pads were placed over the wounds and secured with tape and mesh underwear.  The patient was then placed in supine position and emerged from anesthesia and extubated and brought to the recovery room for further management.     The patient tolerated the procedure well and all counts were correct at the end of the case.   Howie IllJose Luis Roshan Roback, MD

## 2017-07-06 NOTE — Progress Notes (Signed)
Pharmacy Electrolyte Monitoring Consult:  Pharmacy consulted to assist in monitoring and replacing electrolytes in this 77 y.o. female admitted on 07/02/2017 with Abscess  Patient received potassium 20mEq PO x 1 and potassium 10mEq IV Q1hr x 3 doses on 3/19.  Labs:  Sodium (mmol/L)  Date Value  07/06/2017 140   Potassium (mmol/L)  Date Value  07/06/2017 4.0   Magnesium (mg/dL)  Date Value  13/08/657803/22/2019 1.7   Calcium (mg/dL)  Date Value  46/96/295203/22/2019 7.7 (L)    Plan:  3/21 K improved from 3 to 3.2 after Klor-Con 40 mEq po x 1. Magnesium was normal yesterday. Patient is now NPO for procedure. Will give potassium chloride 10 mEq IV Q1H x 4 doses and recheck electrolytes tomorrow with AM labs.   3/22: K 4.0, Mag 1.7.  Will order Magnesium 2 gram IV x 1.  Pharmacy will continue to monitor and adjust per consult.   Rebecca Ball A, Pharm.D., BCPS Clinical Pharmacist 07/06/2017 7:49 AM

## 2017-07-06 NOTE — Progress Notes (Signed)
[facility] CLINIC INFECTIOUS DISEASE PROGRESS NOTE Date of Admission:  07/02/2017     ID: Rebecca Ball is a 77 y.o. female with   DKA and buttock abscess. Active Problems:   Cellulitis and abscess of buttock   DKA (diabetic ketoacidoses) (HCC)   Acute renal failure (HCC)   Subjective: S/p surgery, no fevers  ROS  Eleven systems are reviewed and negative except per hpi  Medications:  Antibiotics Given (last 72 hours)    Date/Time Action Medication Dose Rate   07/04/17 0123 New Bag/Given  [had to reset computer]   piperacillin-tazobactam (ZOSYN) IVPB 3.375 g 3.375 g 12.5 mL/hr   07/04/17 1335 New Bag/Given   piperacillin-tazobactam (ZOSYN) IVPB 3.375 g 3.375 g 12.5 mL/hr   07/04/17 1528 New Bag/Given   vancomycin (VANCOCIN) IVPB 750 mg/150 ml premix 750 mg 150 mL/hr   07/05/17 0100 New Bag/Given   piperacillin-tazobactam (ZOSYN) IVPB 3.375 g 3.375 g 12.5 mL/hr   07/05/17 1423 New Bag/Given   piperacillin-tazobactam (ZOSYN) IVPB 3.375 g 3.375 g 12.5 mL/hr   07/05/17 2253 New Bag/Given   piperacillin-tazobactam (ZOSYN) IVPB 3.375 g 3.375 g 12.5 mL/hr   07/06/17 0626 New Bag/Given   piperacillin-tazobactam (ZOSYN) IVPB 3.375 g 3.375 g 12.5 mL/hr   07/06/17 1421 New Bag/Given   piperacillin-tazobactam (ZOSYN) IVPB 3.375 g 3.375 g 12.5 mL/hr     . aspirin  81 mg Oral Daily  . heparin  5,000 Units Subcutaneous Q8H  . insulin aspart  0-5 Units Subcutaneous QHS  . insulin aspart  0-9 Units Subcutaneous TID WC  . [START ON 07/07/2017] insulin glargine  16 Units Subcutaneous Daily  . metoprolol succinate  25 mg Oral Daily  . multivitamin with minerals  1 tablet Oral Daily  . pantoprazole (PROTONIX) IV  40 mg Intravenous Q12H  . pneumococcal 23 valent vaccine  0.5 mL Intramuscular Tomorrow-1000  . protein supplement shake  11 oz Oral BID BM  . simvastatin  20 mg Oral Daily    Objective: Vital signs in last 24 hours: Temp:  [97.7 F (36.5 C)-98.1 F (36.7 C)] 97.7 F (36.5  C) (03/22 1300) Pulse Rate:  [67-93] 67 (03/22 1300) Resp:  [13-23] 18 (03/22 1300) BP: (97-174)/(42-99) 124/63 (03/22 1300) SpO2:  [76 %-100 %] 97 % (03/22 1300) Constitutional:  oriented to person, place, and time. appears well-developed and well-nourished. No distress.  HENT: [place]/AT, PERRLA, no scleral icterus Mouth/Throat: Oropharynx is clear and moist. No oropharyngeal exudate.  Cardiovascular: Normal rate, regular rhythm and normal heart sounds. Exam reveals no gallop and no friction rub.  No murmur heard.  Pulmonary/Chest: Effort normal and breath sounds normal. No respiratory distress.  has no wheezes.  Neck = supple, no nuchal rigidity Abdominal: Soft. Bowel sounds are normal.  exhibits no distension. There is no tenderness.  Lymphadenopathy: no cervical adenopathy. No axillary adenopathy Neurological: alert and oriented to person, place, and time.  Skin: covered post op Psychiatric: a normal mood and affect.  behavior is normal.     Lab Results Recent Labs    07/04/17 0424 07/05/17 0500 07/06/17 0452  WBC 16.5* 10.5  --   HGB 10.6* 9.8*  --   HCT 33.3* 30.8*  --   NA 135 140 140  K 3.0* 3.3* 4.0  CL 104 109 112*  CO2 19* 19* 16*  BUN 55* 44* 38*  CREATININE 1.84* 1.43* 1.07*    Microbiology: Results for orders placed or performed during the hospital encounter of 07/02/17  Culture, blood (routine  x 2)     Status: None (Preliminary result)   Collection Time: 07/02/17  6:13 PM  Result Value Ref Range Status   Specimen Description BLOOD LEFT ASSIST CONTROL  Final   Special Requests   Final    BOTTLES DRAWN AEROBIC AND ANAEROBIC Blood Culture adequate volume   Culture   Final    NO GROWTH 4 DAYS Performed at [facility], [address] [place], [place] [address]    Report Status PENDING  Incomplete  Culture, blood (routine x 2)     Status: None (Preliminary result)   Collection Time: 07/02/17  6:13 PM  Result Value Ref Range Status   Specimen  Description BLOOD RIGHT ASSIST CONTROL  Final   Special Requests   Final    BLOOD Blood Culture results may not be optimal due to an excessive volume of blood received in culture bottles   Culture   Final    NO GROWTH 4 DAYS Performed at [facility], [address] [place], [place] [address]    Report Status PENDING  Incomplete  Wound or Superficial Culture     Status: Abnormal   Collection Time: 07/02/17  6:13 PM  Result Value Ref Range Status   Specimen Description   Final    BUTTOCKS Performed at [facility], [address] [place], [place] [address]    Special Requests   Final    Normal Performed at [facility], [address] [place], [place] [address]    Gram Stain   Final    NO WBC SEEN NO SQUAMOUS EPITHELIAL CELLS SEEN ABUNDANT GRAM NEGATIVE RODS MODERATE GRAM POSITIVE COCCI IN CLUSTERS MODERATE GRAM POSITIVE RODS Performed at [facility], [address] [place], [place] [address]    Culture MULTIPLE ORGANISMS PRESENT, NONE PREDOMINANT (A)  Final   Report Status 07/04/2017 FINAL  Final  MRSA PCR Screening     Status: None   Collection Time: 07/03/17  1:05 AM  Result Value Ref Range Status   MRSA by PCR NEGATIVE NEGATIVE Final    Comment:        The GeneXpert MRSA Assay (FDA approved for NASAL specimens only), is one component of a comprehensive MRSA colonization surveillance program. It is not intended to diagnose MRSA infection nor to guide or monitor treatment for MRSA infections. Performed at [facility], [address] [place], [place] [address]   Aerobic/Anaerobic Culture (surgical/deep wound)     Status: None (Preliminary result)   Collection Time: 07/05/17  7:27 PM  Result Value Ref Range Status   Specimen Description   Final    [facility] Performed at [facility], [address] [place], [place] [address]    Special Requests   Final    NONE Performed at [facility], [address] [place], [place] [address]    Gram Stain   Final    RARE WBC PRESENT,BOTH PMN AND MONONUCLEAR MODERATE GRAM POSITIVE COCCI RARE GRAM VARIABLE ROD Performed at [facility], [address] [place], [place] [address]    Culture PENDING  Incomplete   Report Status PENDING  Incomplete  C difficile quick scan w PCR reflex     Status: None   Collection Time: 07/06/17 11:00 AM  Result Value Ref Range Status   C Diff antigen NEGATIVE NEGATIVE Final   C Diff toxin NEGATIVE NEGATIVE Final   C Diff interpretation No C. difficile detected.  Final  Comment: Performed at [facility], [address], [place], [place] [address]    Studies/Results: No results found.  Assessment/Plan: Rebecca Ball is a 77 y.o. female with DM (A1c 7.5 in Nov 2018) admitted with R buttock abscess now self draining and DKA.  Gram stain and culture aremixed. On vanco and zosyn. Clinically improving.  MRSA PCR negative. No evidence MRSA at this point so can dc vanco esp with AOCKD.  S/p I and D Recommendations Change from zosyn to unasyn When stable for DC can change to oral augmentin 875 bid for 14 more days with close follow up. Should follow with surgery. If wound continues to have drainage at end of 14 days would not hesitate to extend abx if needed. I will be out of town but please contact me directly if needed.  Thank you very much for the consult. Will follow with you.  Mick Sell   07/06/2017, 4:35 PM

## 2017-07-07 LAB — BASIC METABOLIC PANEL
Anion gap: 9 (ref 5–15)
BUN: 35 mg/dL — AB (ref 6–20)
CHLORIDE: 111 mmol/L (ref 101–111)
CO2: 20 mmol/L — AB (ref 22–32)
CREATININE: 1.07 mg/dL — AB (ref 0.44–1.00)
Calcium: 7.8 mg/dL — ABNORMAL LOW (ref 8.9–10.3)
GFR calc Af Amer: 57 mL/min — ABNORMAL LOW (ref >60)
GFR calc non Af Amer: 49 mL/min — ABNORMAL LOW (ref >60)
GLUCOSE: 313 mg/dL — AB (ref 65–99)
POTASSIUM: 3.6 mmol/L (ref 3.5–5.1)
SODIUM: 140 mmol/L (ref 135–145)

## 2017-07-07 LAB — CULTURE, BLOOD (ROUTINE X 2)
CULTURE: NO GROWTH
CULTURE: NO GROWTH
Special Requests: ADEQUATE

## 2017-07-07 LAB — CBC WITH DIFFERENTIAL/PLATELET
Basophils Absolute: 0.1 10*3/uL (ref 0–0.1)
Basophils Relative: 1 %
EOS ABS: 0.2 10*3/uL (ref 0–0.7)
EOS PCT: 2 %
HCT: 31.3 % — ABNORMAL LOW (ref 35.0–47.0)
Hemoglobin: 9.9 g/dL — ABNORMAL LOW (ref 12.0–16.0)
LYMPHS PCT: 18 %
Lymphs Abs: 1.8 10*3/uL (ref 1.0–3.6)
MCH: 22.7 pg — ABNORMAL LOW (ref 26.0–34.0)
MCHC: 31.5 g/dL — AB (ref 32.0–36.0)
MCV: 72.1 fL — AB (ref 80.0–100.0)
MONO ABS: 0.5 10*3/uL (ref 0.2–0.9)
MONOS PCT: 5 %
Neutro Abs: 7.4 10*3/uL — ABNORMAL HIGH (ref 1.4–6.5)
Neutrophils Relative %: 74 %
PLATELETS: 401 10*3/uL (ref 150–440)
RBC: 4.34 MIL/uL (ref 3.80–5.20)
RDW: 17.3 % — ABNORMAL HIGH (ref 11.5–14.5)
WBC: 10.1 10*3/uL (ref 3.6–11.0)

## 2017-07-07 LAB — GLUCOSE, CAPILLARY
GLUCOSE-CAPILLARY: 282 mg/dL — AB (ref 65–99)
Glucose-Capillary: 311 mg/dL — ABNORMAL HIGH (ref 65–99)

## 2017-07-07 MED ORDER — INSULIN GLARGINE 100 UNIT/ML ~~LOC~~ SOLN
18.0000 [IU] | Freq: Every day | SUBCUTANEOUS | Status: DC
  Administered 2017-07-07: 18 [IU] via SUBCUTANEOUS
  Filled 2017-07-07 (×2): qty 0.18

## 2017-07-07 MED ORDER — AMOXICILLIN-POT CLAVULANATE 875-125 MG PO TABS: 1.0000 | ORAL_TABLET | Freq: Two times a day (BID) | ORAL | 0 refills | Status: DC

## 2017-07-07 MED ORDER — HYDROCODONE-ACETAMINOPHEN 5-325 MG PO TABS: 1.0000 | ORAL_TABLET | ORAL | 0 refills | Status: DC | PRN

## 2017-07-07 NOTE — Progress Notes (Signed)
Pt was given D/C instructions and I taught her husband how to redress her wound above her rectum. Both stated they understood. I removed her IV without incident and placed her in the wheelchair and rolled her out to her husband's car.

## 2017-07-07 NOTE — Clinical Social Work Note (Signed)
CSW spoke with the attending MD about this patient wishing to discharge to STR. PT evaluation is pending. CSW will begin the process of referral. The patient will need prior authorization for discharge to STR. CSW will update as information available.  Argentina PonderKaren Martha Aaliah Jorgenson, MSW, Theresia MajorsLCSWA [phone]

## 2017-07-07 NOTE — Evaluation (Signed)
Physical Therapy Evaluation Patient Details Name: Rebecca Ball MRN: [id number] DOB: [birth year] Today's Date: 07/07/2017   History of Present Illness  presented to ER secondary to R buttocks swelling, tenderness x1 week; admitted with sepsis related to R buttocks abscess, status post I and D (3/21)  Clinical Impression  Upon evaluation, patient alert and oriented; follows all commands and demonstrates good effort with all functional activities.  Bilat UE/LE strength and ROM grossly symmetrical and WFL for basic transfers and gait; pain in buttocks at surgical site.  Patient currently requiring min assist for bed mobility (some reliance on bedrails); min assist for sit/stand, basic transfers and gait (75') with RW.  All mobility slow and guarded, limited balance reactions evident.  Fatigues quickly, but encouraged by her progress during session.  Some concern over ability to tolerate frequency/intensity of mobility required to facilitate safe discharge home; will need to trial stairs prior to discharge (did verbally review technique with patient and husband; recommend additional person with husband to safely complete). Would benefit from skilled PT to address above deficits and promote optimal return to PLOF; recommend transition to STR upon discharge from acute hospitalization.      Follow Up Recommendations SNF    Equipment Recommendations  Rolling Stencil with 5" wheels;3in1 (PT)    Recommendations for Other Services       Precautions / Restrictions Precautions Precautions: Fall Restrictions Weight Bearing Restrictions: No      Mobility  Bed Mobility Overal bed mobility: Needs Assistance Bed Mobility: Supine to Sit;Sit to Supine     Supine to sit: Min assist Sit to supine: Min assist   General bed mobility comments: cuing for hand placement, sequencing; performance improved with cuing for faster movement transitions  Transfers Overall transfer level: Needs  assistance Equipment used: Rolling Stgermain (2 wheeled) Transfers: Sit to/from Stand Sit to Stand: Min assist         General transfer comment: cuing for hand placement, forward weight shift and lift off; very slow, guarded movement transition  Ambulation/Gait Ambulation/Gait assistance: Min assist Ambulation Distance (Feet): 75 Feet Assistive device: Rolling Vanderpol (2 wheeled)     Gait velocity interpretation: Below normal speed for age/gender General Gait Details: forward flexed posture with fair step height/length, decreased cadence and overall gait speed; min cuing for Feutz position; mod WBing/use of RW for stabilization  Stairs            Wheelchair Mobility    Modified Rankin (Stroke Patients Only)       Balance Overall balance assessment: Needs assistance Sitting-balance support: Feet supported;No upper extremity supported Sitting balance-Leahy Scale: Good     Standing balance support: Bilateral upper extremity supported Standing balance-Leahy Scale: Fair                               Pertinent Vitals/Pain Pain Assessment: Faces Faces Pain Scale: Hurts little more Pain Location: R buttocks, surgical site Pain Descriptors / Indicators: Aching;Grimacing Pain Intervention(s): Limited activity within patient's tolerance;Monitored during session;Repositioned    Home Living Family/patient expects to be discharged to:: Private residence Living Arrangements: Spouse/significant other Available Help at Discharge: Family Type of Home: House Home Access: Stairs to enter Entrance Stairs-Rails: None Entrance Stairs-Number of Steps: 2+1+1 Home Layout: One level Home Equipment: Cane - single point      Prior Function Level of Independence: Independent         Comments: Indep with ADLs, household and community mobilization  without assist device; + driving; denies fall history     Hand Dominance        Extremity/Trunk Assessment   Upper  Extremity Assessment Upper Extremity Assessment: Overall WFL for tasks assessed    Lower Extremity Assessment Lower Extremity Assessment: Overall WFL for tasks assessed(grossly at least 4/5 throughout)       Communication   Communication: No difficulties  Cognition Arousal/Alertness: Awake/alert Behavior During Therapy: WFL for tasks assessed/performed Overall Cognitive Status: Within Functional Limits for tasks assessed                                 General Comments: Patient initially notably frustrated with situation, upcoming discharge; provided with active listening and address concerns throughout session.  Much more pleasant/appropriate as session progressed.      General Comments      Exercises Other Exercises Other Exercises: Toilet transfer, SPT with RW, min assist; [facility]/stand from [facility] with Rw, min assist.  Consistent cuing for han dplacement, Chaudhary management.  Static standing balance for peri-care with RW, cga/min assist. Other Exercises: Bed mobility without bedrails, close sup/min assist; encouraged for quicker movement transition to increase fluidity and indep with transition.  Educated husband on position, placement and technique for safe assist in home environment; returned demonstration of appropriate technique (from flat bed surface without rails to simulate home environment) Other Exercises: Verbally reviewed technique for stair training with RW, therapist demonstrating technique.  Do recommend additional person (in addition to husband) for assist; patient/husband to discuss with neighbor.   Assessment/Plan    PT Assessment Patient needs continued PT services  PT Problem List Decreased strength;Decreased range of motion;Decreased activity tolerance;Decreased balance;Decreased mobility;Decreased knowledge of use of DME;Decreased safety awareness;Decreased knowledge of precautions;Pain       PT Treatment Interventions DME instruction;Gait  training;Stair training;Functional mobility training;Therapeutic activities;Therapeutic exercise;Cognitive remediation    PT Goals (Current goals can be found in the Care Plan section)  Acute Rehab PT Goals Patient Stated Goal: to get my strength back PT Goal Formulation: With patient/family Time For Goal Achievement: 07/21/17 Potential to Achieve Goals: Good    Frequency Min 2X/week   Barriers to discharge Decreased caregiver support      Co-evaluation               AM-PAC PT "6 Clicks" Daily Activity  Outcome Measure Difficulty turning over in bed (including adjusting bedclothes, sheets and blankets)?: Unable Difficulty moving from lying on back to sitting on the side of the bed? : Unable Difficulty sitting down on and standing up from a chair with arms (e.g., wheelchair, bedside commode, etc,.)?: Unable Help needed moving to and from a bed to chair (including a wheelchair)?: A Little Help needed walking in hospital room?: A Little Help needed climbing 3-5 steps with a railing? : A Lot 6 Click Score: 11    End of Session Equipment Utilized During Treatment: Gait belt Activity Tolerance: Patient tolerated treatment well Patient left: in bed;with call bell/phone within reach;with bed alarm set;with nursing/sitter in room;with family/visitor present Nurse Communication: Mobility status PT Visit Diagnosis: Difficulty in walking, not elsewhere classified (R26.2);Muscle weakness (generalized) (M62.81)    Time: [id number] PT Time Calculation (min) (ACUTE ONLY): 69 min   Charges:   PT Evaluation $PT Eval Moderate Complexity: 1 Mod PT Treatments $Gait Training: 23-37 mins $Therapeutic Activity: 23-37 mins   PT G Codes:       Orva Riles H. Manson PasseyBrown, PT,  DPT, NCS 07/07/17, 2:05 PM [phone]

## 2017-07-07 NOTE — Clinical Social Work Note (Signed)
[facility] has decided to return home with home health. The CSW is signing off. Please consult should the patient require additional CSW assistance.  Argentina PonderKaren Martha Jalyah Weinheimer, MSW, Theresia MajorsLCSWA [phone]

## 2017-07-07 NOTE — Progress Notes (Signed)
[employer] [facility] [place] [facility]   PATIENT NAME: Rebecca Ball    MR#:  [id number]  DATE OF BIRTH:  10-Apr-1941  SUBJECTIVE:  Patient with weakness wants to go to rehab  REVIEW OF SYSTEMS:    Review of Systems  Constitutional: Negative for fever, chills weight loss +gen weakness HENT: Negative for ear pain, nosebleeds, congestion, facial swelling, rhinorrhea, neck pain, neck stiffness and ear discharge.   Respiratory: Negative for cough, shortness of breath, wheezing  Cardiovascular: Negative for chest pain, palpitations and leg swelling.  Gastrointestinal: no n/v/diarrhea Genitourinary: Negative for dysuria, urgency, frequency, hematuria Musculoskeletal: Has pain in the buttock area. Neurological: Negative for dizziness, seizures, syncope, focal weakness,  numbness and headaches.  Hematological: Does not bruise/bleed easily.  Psychiatric/Behavioral: Negative for hallucinations, confusion, dysphoric mood    Tolerating Diet: yes    DRUG ALLERGIES:   Allergies  Allergen Reactions  . Penicillins Anaphylaxis  . Oxycodone Nausea And Vomiting  . Propoxyphene Nausea And Vomiting    VITALS:  Blood pressure (!) 138/44, pulse 91, temperature 98 F (36.7 C), temperature source Oral, resp. rate 19, height 5\' 4"  (1.626 m), weight 94.2 kg (207 lb 10.8 oz), SpO2 96 %.  PHYSICAL EXAMINATION:  Constitutional: Appears well-developed and well-nourished. No distress. HENT: Normocephalic. Marland Kitchen Oropharynx is clear and moist.  Eyes: Conjunctivae and EOM are normal. PERRLA, no scleral icterus.  Neck: Normal ROM. Neck supple. No JVD. No tracheal deviation. CVS: RRR, S1/S2 +, no murmurs, no gallops, no carotid bruit.  Pulmonary: Effort and breath sounds normal, no stridor, rhonchi, wheezes, rales.  Abdominal: Soft. BS +,  no distension, tenderness, rebound or guarding.  Musculoskeletal: Normal range of motion. No edema and no tenderness.  Neuro: Alert. CN 2-12 grossly  intact. No focal deficits. Skin: Dressing present status post gluteal abscess drainage.penrose drains placed Psychiatric: Normal mood and affect.      LABORATORY PANEL:   CBC Recent Labs  Lab 07/07/17 0559  WBC 10.1  HGB 9.9*  HCT 31.3*  PLT 401   ------------------------------------------------------------------------------------------------------------------  Chemistries  Recent Labs  Lab 07/06/17 0452 07/07/17 0559  NA 140 140  K 4.0 3.6  CL 112* 111  CO2 16* 20*  GLUCOSE 324* 313*  BUN 38* 35*  CREATININE 1.07* 1.07*  CALCIUM 7.7* 7.8*  MG 1.7  --    ------------------------------------------------------------------------------------------------------------------  Cardiac Enzymes No results for input(s): TROPONINI in the last 168 hours. ------------------------------------------------------------------------------------------------------------------  RADIOLOGY:  No results found.   ASSESSMENT AND PLAN:   77 year old female with a history of diabetes who presents due to right buttock abscess.  1. Sepsis: Patient presented with fever, tachycardia and elevated white blood cell count. Sepsis is due to right buttock abscess.,  Sepsis is resolved  2. Right buttock abscess, perirectal abscess: She is status post incision and drainage, she has Penrose drains.  Dr. Sampson Goon has recommended Augmentin for 14 more days.  While in the hospital she was on Zosyn/Unasyn If wound continues to have drainage at end of 14 days would not hesitate to extend abx if needed    3. Acute kidney injury in the setting of poor by mouth intake and ATN with sepsis Creatinine improved with IV fluids, Appreciate nephrology consultation   4. Electrolyte abnormalities including low potassium and magnesium: Electrolytes were repleted.  5. DKA: This has resolved She will continue outpatient medications with ADA diet 6. Essential hypertension: Continue metoprolol   Pt  eval for d/c planning D.w CSW  Management plans discussed with  the patient and husband and she is in agreement.  CODE STATUS: full  TOTAL TIME TAKING CARE OF THIS PATIENT: 24 minutes.     POSSIBLE D/C today , DEPENDING ON CLINICAL CONDITION.   Avelardo Reesman M.D on 07/07/2017 at 11:15 AM  Between 7am to 6pm - Pager - [phone] After 6pm go to [email or web address] - password [email or web address]  [facility]  Office  [phone]  CC: Primary care physician; Leotis ShamesSingh, Jasmine, MD  Note: This dictation was prepared with Dragon dictation along with smaller phrase technology. Any transcriptional errors that result from this process are unintentional.

## 2017-07-07 NOTE — Discharge Summary (Signed)
[facility] - [place] [facility]   PATIENT NAME: Rebecca Ball    MR#:  [id number]  DATE OF BIRTH:  01-Mar-1941  DATE OF ADMISSION:  07/02/2017 ADMITTING PHYSICIAN: Cammy Copa, MD  DATE OF DISCHARGE: 07/07/2017  PRIMARY CARE PHYSICIAN: Leotis Shames, MD    ADMISSION DIAGNOSIS:  Erysipelas [A46] Cellulitis and abscess of buttock [L02.31, L03.317] Acute renal failure, unspecified acute renal failure type (HCC) [N17.9] DKA (diabetic ketoacidoses) (HCC) [E13.10]  DISCHARGE DIAGNOSIS:  Active Problems:   Cellulitis and abscess of buttock   DKA (diabetic ketoacidoses) (HCC)   Acute renal failure (HCC)   SECONDARY DIAGNOSIS:   Past Medical History:  Diagnosis Date  . Diabetes mellitus without complication (HCC)   . Hypertension     HOSPITAL COURSE:   77 year old female with a history of diabetes who presents due to right buttock abscess.  1. Sepsis: Patient presented with fever, tachycardia and elevated white blood cell count. Sepsis is due to right buttock abscess.,   Sepsis is resolved  2. Right buttock abscess, perirectal abscess: She is status post incision and drainage, she has Penrose drains.   Dr. Sampson Goon has recommended Augmentin for 14 more days.  While in the hospital she was on Zosyn/Unasyn If wound continues to have drainage at end of 14 days would not hesitate to extend abx if needed    3. Acute kidney injury in the setting of poor by mouth intake and ATN with sepsis Creatinine improved with IV fluids, Appreciate nephrology consultation   4. Electrolyte abnormalities including low potassium and magnesium: Electrolytes were repleted.  5.  DKA: This has resolved She will continue outpatient medications with ADA diet 6. Essential hypertension: Continue metoprolol     DISCHARGE CONDITIONS AND DIET:  stable for discharge and diabetic diet   CONSULTS OBTAINED:  Treatment Team:  Mick Sell, MD Mosetta Pigeon,  MD Ricarda Frame, MD  DRUG ALLERGIES:   Allergies  Allergen Reactions  . Penicillins Anaphylaxis  . Oxycodone Nausea And Vomiting  . Propoxyphene Nausea And Vomiting    DISCHARGE MEDICATIONS:   Allergies as of 07/07/2017      Reactions   Penicillins Anaphylaxis   Oxycodone Nausea And Vomiting   Propoxyphene Nausea And Vomiting      Medication List    TAKE these medications   amoxicillin-clavulanate 875-125 MG tablet Commonly known as:  AUGMENTIN Take 1 tablet by mouth 2 (two) times daily for 14 days.   aspirin 81 MG chewable tablet Chew 81 mg by mouth daily.   hydrochlorothiazide 25 MG tablet Commonly known as:  HYDRODIURIL Take 25 mg by mouth daily.   HYDROcodone-acetaminophen 5-325 MG tablet Commonly known as:  NORCO/VICODIN Take 1 tablet by mouth every 4 (four) hours as needed for moderate pain or severe pain.   insulin aspart 100 UNIT/ML injection Commonly known as:  novoLOG Inject into the skin 3 (three) times daily before meals.   insulin glargine 100 UNIT/ML injection Commonly known as:  LANTUS Inject 75 Units into the skin every morning.   metFORMIN 1000 MG tablet Commonly known as:  GLUCOPHAGE Take 1,000 mg by mouth 2 (two) times daily with a meal.   simvastatin 20 MG tablet Commonly known as:  ZOCOR Take 20 mg by mouth daily.   TOPROL XL 25 MG 24 hr tablet Generic drug:  metoprolol succinate Take 25 mg by mouth daily.            Discharge Care Instructions  (From admission, onward)  Start     Ordered   07/07/17 0000  Discharge wound care:    Comments:  Change dressing daily with gauze.  Keep drain clean.   07/07/17 1039        Today   CHIEF COMPLAINT:  Weak Doing ok   VITAL SIGNS:  Blood pressure (!) 138/44, pulse 91, temperature 98 F (36.7 C), temperature source Oral, resp. rate 19, height 5\' 4"  (1.626 m), weight 94.2 kg (207 lb 10.8 oz), SpO2 96 %.   REVIEW OF SYSTEMS:  Review of Systems  Constitutional:  Positive for malaise/fatigue. Negative for chills and fever.  HENT: Negative.  Negative for ear discharge, ear pain, hearing loss, nosebleeds and sore throat.   Eyes: Negative.  Negative for blurred vision and pain.  Respiratory: Negative.  Negative for cough, hemoptysis, shortness of breath and wheezing.   Cardiovascular: Negative.  Negative for chest pain, palpitations and leg swelling.  Gastrointestinal: Negative.  Negative for abdominal pain, blood in stool, diarrhea, nausea and vomiting.  Genitourinary: Negative.  Negative for dysuria.  Musculoskeletal: Negative.  Negative for back pain.  Skin: Negative.   Neurological: Negative for dizziness, tremors, speech change, focal weakness, seizures and headaches.  Endo/Heme/Allergies: Negative.  Does not bruise/bleed easily.  Psychiatric/Behavioral: Negative.  Negative for depression, hallucinations and suicidal ideas.     PHYSICAL EXAMINATION:  GENERAL:  77 y.o.-year-old patient lying in the bed with no acute distress.  NECK:  Supple, no jugular venous distention. No thyroid enlargement, no tenderness.  LUNGS: Normal breath sounds bilaterally, no wheezing, rales,rhonchi  No use of accessory muscles of respiration.  CARDIOVASCULAR: S1, S2 normal. No murmurs, rubs, or gallops.  ABDOMEN: Soft, non-tender, non-distended. Bowel sounds present. No organomegaly or mass.  EXTREMITIES: No pedal edema, cyanosis, or clubbing.  PSYCHIATRIC: The patient is alert and oriented x 3.  SKIN: penrose drain in place  DATA REVIEW:   CBC Recent Labs  Lab 07/07/17 0559  WBC 10.1  HGB 9.9*  HCT 31.3*  PLT 401    Chemistries  Recent Labs  Lab 07/06/17 0452 07/07/17 0559  NA 140 140  K 4.0 3.6  CL 112* 111  CO2 16* 20*  GLUCOSE 324* 313*  BUN 38* 35*  CREATININE 1.07* 1.07*  CALCIUM 7.7* 7.8*  MG 1.7  --     Cardiac Enzymes No results for input(s): TROPONINI in the last 168 hours.  Microbiology Results  @MICRORSLT48 @  RADIOLOGY:  No  results found.    Allergies as of 07/07/2017      Reactions   Penicillins Anaphylaxis   Oxycodone Nausea And Vomiting   Propoxyphene Nausea And Vomiting      Medication List    TAKE these medications   amoxicillin-clavulanate 875-125 MG tablet Commonly known as:  AUGMENTIN Take 1 tablet by mouth 2 (two) times daily for 14 days.   aspirin 81 MG chewable tablet Chew 81 mg by mouth daily.   hydrochlorothiazide 25 MG tablet Commonly known as:  HYDRODIURIL Take 25 mg by mouth daily.   HYDROcodone-acetaminophen 5-325 MG tablet Commonly known as:  NORCO/VICODIN Take 1 tablet by mouth every 4 (four) hours as needed for moderate pain or severe pain.   insulin aspart 100 UNIT/ML injection Commonly known as:  novoLOG Inject into the skin 3 (three) times daily before meals.   insulin glargine 100 UNIT/ML injection Commonly known as:  LANTUS Inject 75 Units into the skin every morning.   metFORMIN 1000 MG tablet Commonly known as:  GLUCOPHAGE Take 1,000  mg by mouth 2 (two) times daily with a meal.   simvastatin 20 MG tablet Commonly known as:  ZOCOR Take 20 mg by mouth daily.   TOPROL XL 25 MG 24 hr tablet Generic drug:  metoprolol succinate Take 25 mg by mouth daily.            Discharge Care Instructions  (From admission, onward)        Start     Ordered   07/07/17 0000  Discharge wound care:    Comments:  Change dressing daily with gauze.  Keep drain clean.   07/07/17 1039          Management plans discussed with the patient and she is in agreement. Stable for discharge   Patient should follow up with surgery  CODE STATUS:     Code Status Orders  (From admission, onward)        Start     Ordered   07/03/17 0103  Full code  Continuous     07/03/17 0102    Code Status History    This patient has a current code status but no historical code status.    Advance Directive Documentation     Most Recent Value  Type of Advance Directive   Healthcare Power of Attorney, Living will  Pre-existing out of facility DNR order (yellow form or pink MOST form)  -  "MOST" Form in Place?  -      TOTAL TIME TAKING CARE OF THIS PATIENT: 39 minutes.    Note: This dictation was prepared with Dragon dictation along with smaller phrase technology. Any transcriptional errors that result from this process are unintentional.  Onaje Warne M.D on 07/07/2017 at 10:40 AM  Between 7am to 6pm - Pager - [phone] After 6pm go to [email or web address] - Social research officer, governmentpassword [email or web address] [email or web address]  [facility]  Office  [phone]  CC: Primary care physician; Leotis ShamesSingh, Jasmine, MD

## 2017-07-07 NOTE — Progress Notes (Signed)
Pharmacy Electrolyte Monitoring Consult:  Pharmacy consulted to assist in monitoring and replacing electrolytes in this 77 y.o. female admitted on 07/02/2017 with Abscess  Labs:  Sodium (mmol/L)  Date Value  07/07/2017 140   Potassium (mmol/L)  Date Value  07/07/2017 3.6   Magnesium (mg/dL)  Date Value  81/19/147803/22/2019 1.7   Calcium (mg/dL)  Date Value  29/56/213003/23/2019 7.8 (L)    Plan:  3/23 Electrolytes WNL. No supplementation is warranted at this time. Recheck electrolytes tomorrow with AM labs.   Pharmacy will continue to monitor and adjust per consult.   Stormy CardKatsoudas,Mel Langan K, Houston Medical CenterRPH Clinical Pharmacist 07/07/2017 8:48 AM

## 2017-07-07 NOTE — Clinical Social Work Note (Signed)
Clinical Social Work Assessment  Patient Details  Name: Rebecca Ball MRN: [id number] Date of Birth: [birth year]  Date of referral:  07/07/17               Reason for consult:  Facility Placement                Permission sought to share information with:  Chartered certified accountant granted to share information::  Yes, Verbal Permission Granted  Name::        Agency::  [facility] [facility]  Relationship::     Contact Information:     Housing/Transportation Living arrangements for the past 2 months:  [place] of Information:  Patient, Medical Team, Spouse Patient Interpreter Needed:  None Criminal Activity/Legal Involvement Pertinent to Current Situation/Hospitalization:  No - Comment as needed Significant Relationships:  Warehouse manager, Spouse Lives with:  Spouse Do you feel safe going back to the place where you live?  Yes Need for family participation in patient care:  No (Coment)  Care giving concerns:  Patient worried about care at home due to increased weakness   Social Worker assessment / plan:  The CSW met with the patient and her husband at bedside to discuss possible SNF placement. The CSW explained the referral process and the imminent discharge. At that time, PT was beginning the evaluation and noted that she would update the CSW as soon as possible to the recommendation. At baseline, the patient lives with her spouse, and she reports increased weakness in the past 2 days causing need for more assistance. The patient's spouse reports that he will not be able to provide the level of assistance that she may need.   The CSW will begin the bed search for SNF in the event that PT recommends such and the family/patient agree to the beds offered. The patient will require prior authorization from her insurance company. The patient and her spouse are aware of the need for prior authorization. CSW is following.  Employment status:   Retired Nurse, adult PT Recommendations:  (PT is pending) Information / Referral to community resources:  [place]  Patient/Family's Response to care:  The patient and her husband are understandably frustrated with the process of evaluation and discharge planning.   Patient/Family's Understanding of and Emotional Response to Diagnosis, Current Treatment, and Prognosis: The patient and her spouse are aware of the possible discharge options. They are frustrated with the process; however, they were not unpleasant. The patient's PT recommendation is pending.   Emotional Assessment Appearance:  Appears younger than stated age Attitude/Demeanor/Rapport:  Reactive, Engaged, Complaining Affect (typically observed):  Agitated, Frustrated Orientation:  Oriented to Self, Oriented to Place, Oriented to  Time, Oriented to Situation Alcohol / Substance use:  Never Used Psych involvement (Current and /or in the community):  No (Comment)  Discharge Needs  Concerns to be addressed:  Care Coordination, Discharge Planning Concerns Readmission within the last 30 days:  No Current discharge risk:  Chronically ill Barriers to Discharge:  Continued Medical Work up   [employer], LCSW 07/07/2017, 12:06 PM

## 2017-07-07 NOTE — [place] FL2 (Signed)
[place] MEDICAID FL2 LEVEL OF CARE SCREENING TOOL     IDENTIFICATION  Patient Name: Rebecca Ball Birthdate: 1940-10-29 Sex: female Admission Date (Current Location): 07/02/2017  [place]   Facility and Address:  [facility], [address], [place], [place] [address]      Provider Number: [id number]  Attending Physician Name and Address:  Adrian SaranMody, Sital, MD  Relative Name and Phone Number:  Leota JacobsenClyde Faiola [phone]) Spouse    Current Level of Care: Hospital Recommended Level of Care: Skilled Nursing Facility Prior Approval Number:    Date Approved/Denied: 07/07/17 PASRR Number: [id number]  Discharge Plan: SNF    Current Diagnoses: Patient Active Problem List   Diagnosis Date Noted  . DKA (diabetic ketoacidoses) (HCC) 07/03/2017  . Acute renal failure (HCC)   . Cellulitis and abscess of buttock 07/02/2017    Orientation RESPIRATION BLADDER Height & Weight     Self, Time, Situation, Place  Normal Continent Weight: 207 lb 10.8 oz (94.2 kg) Height:  5\' 4"  (162.6 cm)  BEHAVIORAL SYMPTOMS/MOOD NEUROLOGICAL BOWEL NUTRITION STATUS      Continent Diet(Carb modified)  AMBULATORY STATUS COMMUNICATION OF NEEDS Skin   Extensive Assist Verbally Skin abrasions                       Personal Care Assistance Level of Assistance  Bathing, Feeding, Dressing Bathing Assistance: Limited assistance Feeding assistance: Independent Dressing Assistance: Limited assistance     Functional Limitations Info             SPECIAL CARE FACTORS FREQUENCY  PT (By licensed PT)     PT Frequency: Up to 5X per day/5 days per week              Contractures Contractures Info: Not present    Additional Factors Info  Code Status, Allergies, Psychotropic Code Status Info: Full Allergies Info: Penicillins, Oxycodone, Propoxyphene Psychotropic Info: Trazodone         Current Medications (07/07/2017):  This is the  current hospital active medication list Current Facility-Administered Medications  Medication Dose Route Frequency Provider Last Rate Last Dose  . 0.9 %  sodium chloride infusion   Intravenous Continuous Katha HammingKonidena, Snehalatha, MD 50 mL/hr at 07/06/17 1206    . acetaminophen (TYLENOL) tablet 1,000 mg  1,000 mg Oral Q6H PRN Piscoya, Jose, MD      . Ampicillin-Sulbactam (UNASYN) 3 g in sodium chloride 0.9 % 100 mL IVPB  3 g Intravenous Q6H Mick SellFitzgerald, David P, MD   Stopped at 07/07/17 [phone]  . aspirin chewable tablet 81 mg  81 mg Oral Daily Piscoya, Jose, MD   81 mg at 07/07/17 0957  . heparin injection 5,000 Units  5,000 Units Subcutaneous Q8H Henrene DodgePiscoya, Jose, MD   5,000 Units at 07/07/17 0527  . HYDROcodone-acetaminophen (NORCO/VICODIN) 5-325 MG per tablet 1-2 tablet  1-2 tablet Oral Q4H PRN Henrene DodgePiscoya, Jose, MD   2 tablet at 07/07/17 1009  . HYDROmorphone (DILAUDID) injection 0.5 mg  0.5 mg Intravenous Q4H PRN Piscoya, Jose, MD      . insulin aspart (novoLOG) injection 0-5 Units  0-5 Units Subcutaneous QHS Henrene DodgePiscoya, Jose, MD   4 Units at 07/06/17 2147  . insulin aspart (novoLOG) injection 0-9 Units  0-9 Units Subcutaneous TID WC Henrene DodgePiscoya, Jose, MD   5 Units at 07/07/17 [phone]  . insulin glargine (LANTUS) injection 18 Units  18 Units Subcutaneous Daily Adrian SaranMody, Sital, MD   18 Units at 07/07/17  1610  . metoprolol succinate (TOPROL-XL) 24 hr tablet 25 mg  25 mg Oral Daily Piscoya, Jose, MD   25 mg at 07/07/17 0957  . multivitamin with minerals tablet 1 tablet  1 tablet Oral Daily Henrene Dodge, MD   1 tablet at 07/07/17 0957  . ondansetron (ZOFRAN) tablet 4 mg  4 mg Oral Q6H PRN Piscoya, Jose, MD       Or  . ondansetron (ZOFRAN) injection 4 mg  4 mg Intravenous Q6H PRN Henrene Dodge, MD   4 mg at 07/06/17 0643  . pantoprazole (PROTONIX) injection 40 mg  40 mg Intravenous Q12H Katha Hamming, MD   40 mg at 07/07/17 0958  . pneumococcal 23 valent vaccine (PNU-IMMUNE) injection 0.5 mL  0.5 mL Intramuscular  Tomorrow-1000 Piscoya, Jose, MD      . protein supplement (PREMIER PROTEIN) liquid  11 oz Oral BID BM Piscoya, Jose, MD   11 oz at 07/07/17 0959  . simvastatin (ZOCOR) tablet 20 mg  20 mg Oral Daily Piscoya, Jose, MD   20 mg at 07/07/17 0957  . traZODone (DESYREL) tablet 25 mg  25 mg Oral QHS PRN Henrene Dodge, MD   25 mg at 07/05/17 2252     Discharge Medications: Please see discharge summary for a list of discharge medications.  Relevant Imaging Results:  Relevant Lab Results:   Additional Information SS#[id number]  Judi Cong, LCSW

## 2017-07-07 NOTE — Care Management Note (Signed)
Case Management Note  Patient Details  Name: Rebecca PeersJustina R Aldea MRN: [id number] Date of Birth: 1940/07/20  Subjective/Objective:                  Spoke with patient and her husband regarding discharge today. She has been able to walk with a cane. She has no DME. She requests Harvie and bedside commode. She wants to go home today. She wants home health and doesn't care what agency we use. PCP is Dr. Thedore MinsSingh.   Action/Plan:   [facility]. CSW updated.   Expected Discharge Date:  07/07/17               Expected Discharge Plan:     In-House Referral:  Clinical Social Work  Discharge planning Services  CM Consult  Post Acute Care Choice:  Home Health, Durable Medical Equipment Choice offered to:  Spouse  DME Arranged:    DME Agency:     HH Arranged:  PT, Social Work, Therapist, musicN HH Agency:     Status of Service:  In process, will continue to follow  If discussed at Long Length of Stay Meetings, dates discussed:    Additional Comments:  Collie Siadngela Makayla Confer, RN 07/07/2017, 12:43 PM

## 2017-07-08 NOTE — Progress Notes (Signed)
Patient is allergic to Augmentin/penicillin causes hives.  I will discharge patient on Bactrim double strength 1 tablet p.o. twice daily for 14 days.

## 2017-07-11 ENCOUNTER — Telehealth: Payer: Self-pay | Admitting: General Surgery

## 2017-07-11 ENCOUNTER — Telehealth: Payer: Self-pay

## 2017-07-11 LAB — AEROBIC/ANAEROBIC CULTURE W GRAM STAIN (SURGICAL/DEEP WOUND)

## 2017-07-11 LAB — AEROBIC/ANAEROBIC CULTURE (SURGICAL/DEEP WOUND)

## 2017-07-11 NOTE — Telephone Encounter (Signed)
Flagged on EMMI report for having other questions/problems.  Called and spoke with patient.  Per patient, she does not have any questions about her discharge, though has some for her home health nurse that will be coming out later this morning.  She did offer feedback that she was not happy with her discharge as she felt things were rushed and was made to feel "disposable" because she was "old".  She felt she was left uniformed about what to espect at home. She also offered feedback that she has received too many automated calls since discharge (mentioned some are from home health agency) and that she was not appreciated that the selections for them were only "yes or no" answers.  She mentioned she would rather not receive any automated calls.  I apologized for the experience she had during her discharge and thanked her for the feedback.  I also informed her that she would receive one final automated call checking on her on in the next few days.  She expressed appreciation for my call checking on her and mentioned if she did not want to participate in the final automated call that she would hang up once received.

## 2017-07-11 NOTE — Telephone Encounter (Signed)
Rebecca RivalSherry Miles with [facility] is calling to get orders on patient so they can go out to the patients home weekly. Please call Cordelia PenSherry back at [phone].

## 2017-07-12 ENCOUNTER — Ambulatory Visit (INDEPENDENT_AMBULATORY_CARE_PROVIDER_SITE_OTHER): Payer: Medicare Other | Admitting: Cardiothoracic Surgery

## 2017-07-12 ENCOUNTER — Telehealth: Payer: Self-pay

## 2017-07-12 ENCOUNTER — Other Ambulatory Visit: Payer: Self-pay

## 2017-07-12 ENCOUNTER — Encounter: Payer: Self-pay | Admitting: Cardiothoracic Surgery

## 2017-07-12 VITALS — BP 131/65 | HR 77 | Temp 97.8°F | Ht 64.0 in | Wt 206.0 lb

## 2017-07-12 DIAGNOSIS — L03317 Cellulitis of buttock: Secondary | ICD-10-CM

## 2017-07-12 DIAGNOSIS — L0231 Cutaneous abscess of buttock: Secondary | ICD-10-CM

## 2017-07-12 DIAGNOSIS — R1011 Right upper quadrant pain: Secondary | ICD-10-CM

## 2017-07-12 MED ORDER — NYSTATIN-TRIAMCINOLONE 100000-0.1 UNIT/GM-% EX OINT: 1.0000 "application " | TOPICAL_OINTMENT | Freq: Two times a day (BID) | CUTANEOUS | 0 refills | Status: DC

## 2017-07-12 NOTE — Progress Notes (Signed)
Ms. Rebecca Ball returns today in follow-up of her complicated perirectal abscess involving the right buttock.  Approximately 10 days ago she underwent extensive drainage of the left buttock area with the placement of multiple Penrose drains.  There are 5 exit and entrance sites along the right buttock.  She is currently being treated with Bactrim and has had some diarrhea but is able to keep the area very clean.  She is a diabetic and she states that her blood sugars have been in the 300-350 range during the afternoons.  She has not been getting her incisions wet.  She does have frequent thin stools and tries to keep the areas clean as possible.  She does state that her perirectal area feels much better.  There is no pain upon palpation or upon the defecation.  She has had no fevers.  She has developed a rash along the posterior aspect of her buttocks.  This involves both right and left sides.  She thinks it may be related to tape although it has more the appearance of a yeast infection.  Examination of the buttock reveals multiple Penrose drains that are currently sutured in place.  There is some thin watery discharge from the wounds.  There is no fluctuance.  There is minimal induration.  There is no pain upon palpation and minimal warmth associated with this.  There is a papular rash which does appear like a yeast infection involving the buttock and intergluteal folds.  We will prescribe nystatin ointment to apply to the affected areas.  She does live with her husband and I explained that she could get into the shower and wash over this area.  She does have a walk-in shower which she feels quite comfortable using.  I explained to her that she should take the dressings off get in the shower twice a day and allow that area to be thoroughly washed.  She will not place any ointments or creams on the wound itself but on the peri-wound skin.  She will continue her antibiotics.  She will come back to see us again  in 1 week.  She will also see her diabetic physician tomorrow.  We encouraged her to try to keep her blood sugars under good control.  She knows to call if there are any problems.

## 2017-07-12 NOTE — Patient Instructions (Signed)
Please pick up your medication at the pharmacy today and begin using.  Please shower twice daily. Please clean wound with soap and water.  Pat dry the area and apply a dressing over the area of just wear the diaper.  Please see your follow up appointment listed below.

## 2017-07-12 NOTE — Telephone Encounter (Signed)
Verbal orders provided for wound care at this time.  Cordelia PenSherry also asked if physician can check rash on patients buttocks today at her office visit.

## 2017-07-13 DIAGNOSIS — R197 Diarrhea, unspecified: Secondary | ICD-10-CM | POA: Insufficient documentation

## 2017-07-13 DIAGNOSIS — M25473 Effusion, unspecified ankle: Secondary | ICD-10-CM | POA: Insufficient documentation

## 2017-07-13 DIAGNOSIS — K610 Anal abscess: Secondary | ICD-10-CM | POA: Insufficient documentation

## 2017-07-17 NOTE — Telephone Encounter (Signed)
Error

## 2017-07-18 ENCOUNTER — Encounter: Payer: Self-pay | Admitting: Surgery

## 2017-07-18 ENCOUNTER — Ambulatory Visit: Payer: Medicare Other | Admitting: Surgery

## 2017-07-18 VITALS — BP 132/66 | HR 76 | Temp 98.5°F | Ht 64.0 in | Wt 206.0 lb

## 2017-07-18 DIAGNOSIS — L0231 Cutaneous abscess of buttock: Secondary | ICD-10-CM

## 2017-07-18 MED ORDER — SULFAMETHOXAZOLE-TRIMETHOPRIM 800-160 MG PO TABS: 1.0000 | ORAL_TABLET | Freq: Two times a day (BID) | ORAL | 0 refills | Status: DC

## 2017-07-18 NOTE — Progress Notes (Signed)
Surgical Clinic Progress/Follow-up Note   HPI:  77 y.o. Female presents to clinic for post-op follow-up evaluation 2 weeks s/p incision and drainage of a large Right buttock abscess, for which infectious disease physician (Dr. Sampson GoonFitzgerald) advised 14 additional days of Augmentin upon hospital discharge. When patient was seen last week, it seems she was complaining of nausea, diarrhea, and overall GI upset, attributed to her antibiotics and for which it seems her antibiotics were changed to Bactrim. Patient was also prescribed anti-fungal cream due to peri-incisional fungal skin infection. Patient reports her wound drainage, painful peri-incisional erythema, and diarrhea/GI upset have all improved. She otherwise denies any fever/chills, N/V, CP, or SOB.  Review of Systems:  Constitutional: denies any other weight loss, fever, chills, or sweats  Eyes: denies any other vision changes, history of eye injury  ENT: denies sore throat, hearing problems  Respiratory: denies shortness of breath, wheezing  Cardiovascular: denies chest pain, palpitations  Gastrointestinal: denies abdominal pain, N/V, or diarrhea Musculoskeletal: denies any other joint pains or cramps  Skin: Rash and skin discoloration as per HPI  Neurological: denies any other headache, dizziness, weakness  Psychiatric: denies any other depression, anxiety  All other review of systems: otherwise negative   Vital Signs:  BP 132/66   Pulse 76   Temp 98.5 F (36.9 C) (Oral)   Ht 5\' 4"  (1.626 m)   Wt 206 lb (93.4 kg)   BMI 35.36 kg/m    Physical Exam:  Constitutional:  -- Obese body habitus  -- Awake, alert, and oriented x3  Eyes:  -- Pupils equally round and reactive to light  -- No scleral icterus  Ear, nose, throat:  -- No jugular venous distension  -- No nasal drainage, bleeding Pulmonary:  -- No crackles -- Equal breath sounds bilaterally -- Breathing non-labored at rest Cardiovascular:  -- S1, S2 present  -- No  pericardial rubs  Gastrointestinal:  -- Soft, nontender, non-distended, no guarding/rebound  -- No abdominal masses appreciated, pulsatile or otherwise  Musculoskeletal / Integumentary:  -- Wounds or skin discoloration: Right buttock Penrose drains x3 well-secured with no appreciable drainage, no peri-incisional erythema (fungal or cellulitic), and mild peri-incisional tenderness to palpation -- Extremities: B/L UE and LE FROM, hands and feet warm, no edema  Neurologic:  -- Motor function: intact and symmetric  -- Sensation: intact and symmetric   Assessment:  77 y.o. yo Female with a problem list including...  Patient Active Problem List   Diagnosis Date Noted  . Ankle edema 07/13/2017  . Diarrhea, unspecified 07/13/2017  . Perianal abscess 07/13/2017  . DKA (diabetic ketoacidoses) (HCC) 07/03/2017  . Acute renal failure (HCC)   . Cellulitis and abscess of buttock 07/02/2017  . Pure hypercholesterolemia 12/15/2016  . Closed compression fracture of L3 lumbar vertebra 06/08/2016  . Low back pain with left-sided sciatica 06/08/2016  . Osteopenia of spine 06/08/2016  . Mixed hyperlipidemia 05/29/2014  . Vitamin B 12 deficiency 05/29/2014  . Anemia, unspecified 05/11/2014  . Essential hypertension 02/26/2014  . 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 Vitamin D deficiency, unspecified 02/26/2014    presents to clinic for post-op follow-up evaluation, doing well 2 weeks s/p incision and drainage of large Right buttock abscess.  Plan:   - Penrose drains removed  - Bactrim extended x 3 days to complete 14 days post-drainage antibiotics recommended by ID consultant, including 3 days after removal of Penrose drains  - okay to continue showering, though  wait until wounds healed before submerging under water  - return to clinic in 1 week for one additional wound check with patient's operative surgeon  - instructed  to call office if any questions or concerns  All of the above recommendations were discussed with the patient and patient's family, and all of patient's and family's questions were answered to their expressed satisfaction.  -- Scherrie Gerlach Earlene Plater, MD, RPVI [place]: [facility] - Partnering for exceptional care. Office: [phone]

## 2017-07-18 NOTE — Patient Instructions (Addendum)
We would like for you to extend taking the Antibiotic for 3 more days. Please pick up the additional Medication at the pharmacy.  Please continue to wash the area with soap and water daily. You will need to change the dressing when it becomes soiled.    Please see your follow up appointment listed below.

## 2017-07-27 ENCOUNTER — Encounter: Payer: Self-pay | Admitting: Surgery

## 2017-07-27 ENCOUNTER — Ambulatory Visit: Payer: Medicare Other | Admitting: Surgery

## 2017-07-27 VITALS — BP 129/66 | HR 80 | Temp 97.9°F | Ht 64.0 in | Wt 200.8 lb

## 2017-07-27 DIAGNOSIS — Z09 Encounter for follow-up examination after completed treatment for conditions other than malignant neoplasm: Secondary | ICD-10-CM

## 2017-07-27 NOTE — Patient Instructions (Addendum)
Continue to wash the area with soap and water daily until healed completely.   Please see your follow up appointment listed below.

## 2017-07-27 NOTE — Progress Notes (Signed)
07/27/2017  HPI: Patient is s/p I&D of perirectal and gluteal abscess.  Penrose drains had been removed on her last visit.  She was switched to bactrim because of GI upset from Augmentin.  She then developed a rash over her arms and legs.  She finished taking the Bactrim and is using Benadryl for her rash  Vital signs: BP 129/66   Pulse 80   Temp 97.9 F (36.6 C) (Oral)   Ht 5\' 4"  (1.626 m)   Wt 200 lb 12.8 oz (91.1 kg)   BMI 34.47 kg/m    Physical Exam: Constitutional: No acute distress Rectal:  Perianal and gluteal I&D sites healing well, without any significant drainage, induration, or erythema.  Wounds dressed with ABD pad.  Assessment/Plan: 77 yo female s/p I&D of left perirectal and gluteal abscess  --no further antibiotics needed.  Will add bactrim to her allergy list --continue dry gauze dressing changes.  No packing needed --will follow up in 2 weeks for wound check. --she can continue benadryl as needed for her rash and can also use over the counter hydrocortisone ointment   Howie IllJose Luis Marios Gaiser, MD [facility]

## 2017-08-08 ENCOUNTER — Encounter: Payer: Self-pay | Admitting: Surgery

## 2017-08-28 ENCOUNTER — Encounter: Payer: Self-pay | Admitting: Surgery

## 2017-08-30 ENCOUNTER — Encounter: Payer: Self-pay | Admitting: Surgery

## 2018-01-28 IMAGING — RF DG SWALLOWING FUNCTION
12 series · 13 of 24 positions shown · non-contrast
Comparison: None in PACs

CLINICAL DATA: Patient has experienced solids being stuck in the
throat and having to regurgitate them. This is especially true with
meats. Reportedly had extra bone growth in the posterior oropharynx.

EXAM:
MODIFIED BARIUM SWALLOW
TECHNIQUE: Different consistencies of barium were administered orally to the
patient by the Speech Pathologist. Imaging of the pharynx was
performed in the lateral projection.
FLUOROSCOPY TIME:  Fluoroscopy Time:  1 minutes, 12 seconds
Radiation Exposure Index (if provided by the fluoroscopic device):
81.04 micro Gy per meter squared
Number of Acquired Spot Images: 11 video loops

[Series 1: run · 1 of 78 frames shown (1 of 12)]
[frame 12/78]
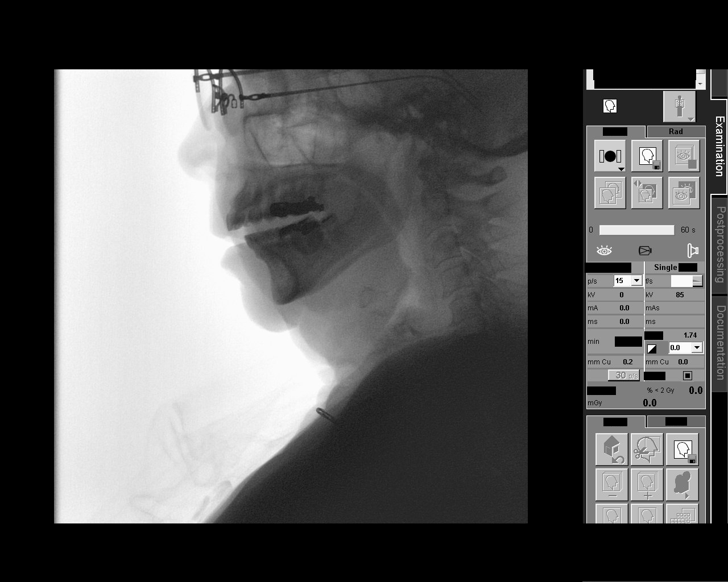

[Series 2: run · 1 of 110 frames shown (2 of 12)]
[frame 17/110]
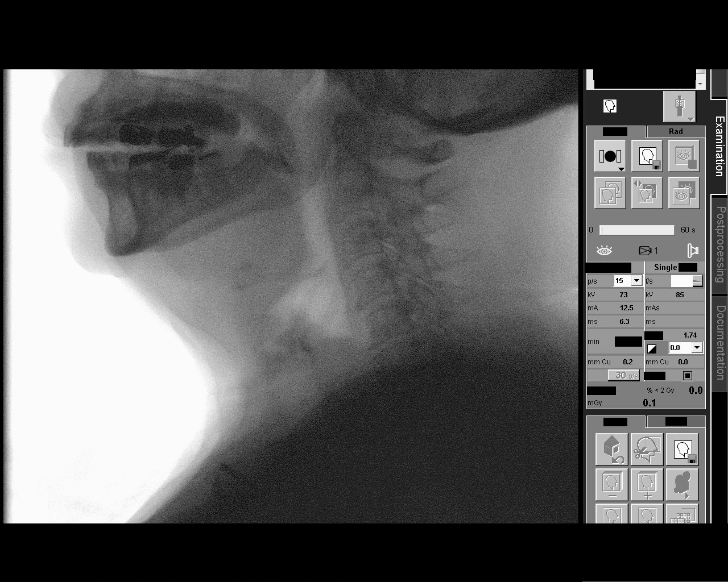

[Series 3: run · 1 of 124 frames shown (3 of 12)]
[frame 19/124]
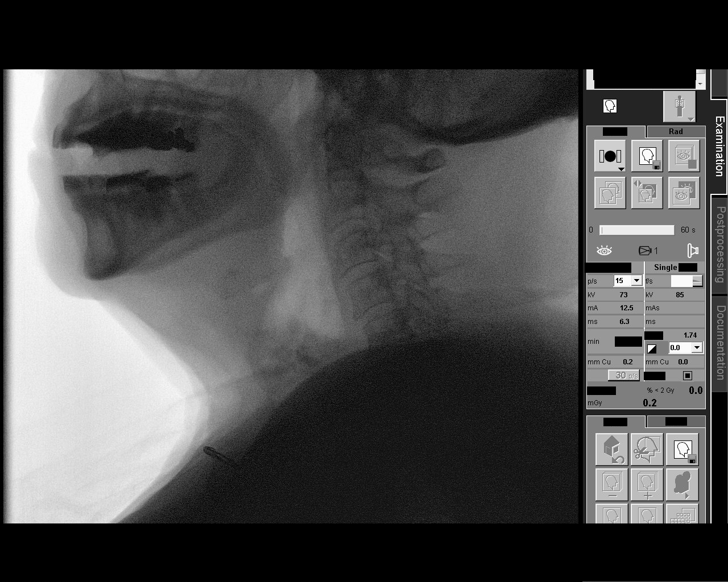

[Series 4: run · 1 of 413 frames shown (4 of 12)]
[frame 34/413]
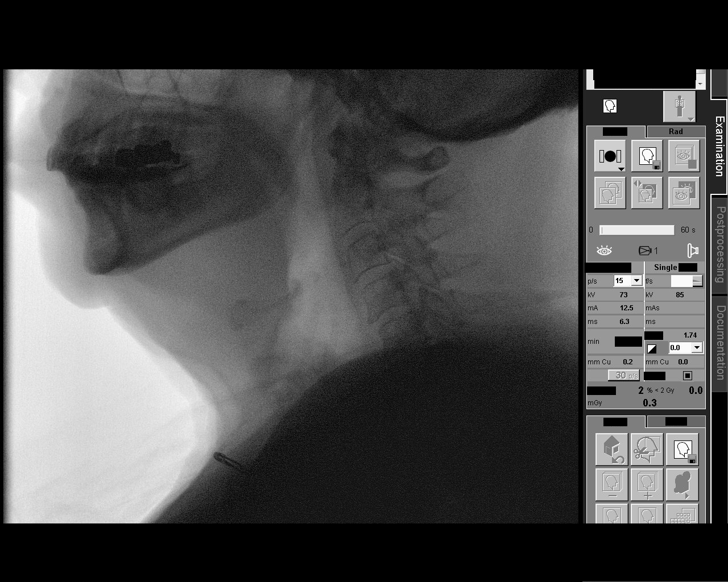

[Series 5: run · 1 of 295 frames shown (5 of 12)]
[frame 45/295]
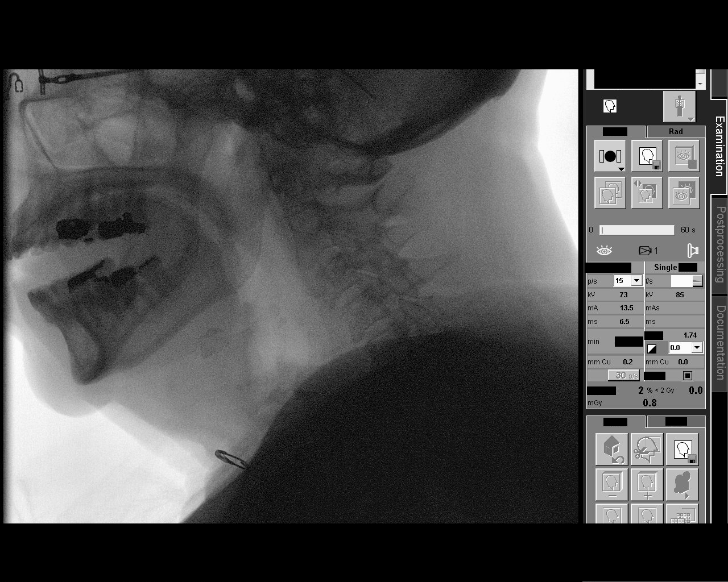

[Series 6: run · 1 of 154 frames shown (6 of 12)]
[frame 24/154]
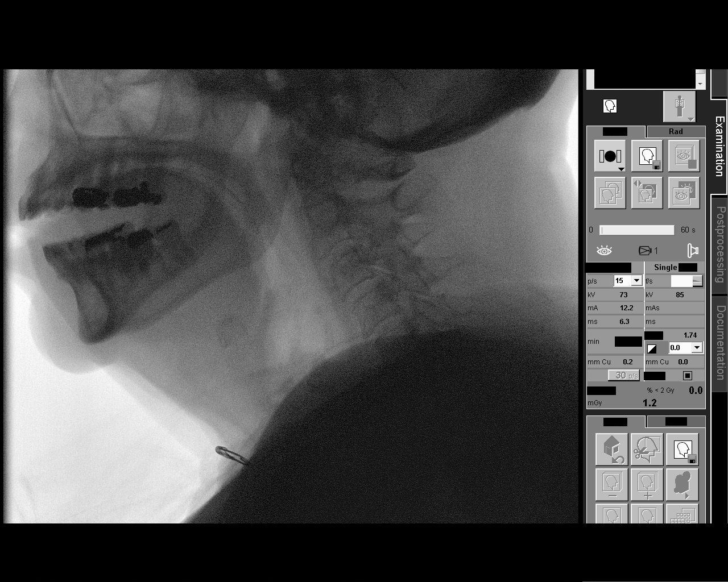

[Series 7: run · 2 of 377 frames shown (7 of 12)]
[frame 57/377]
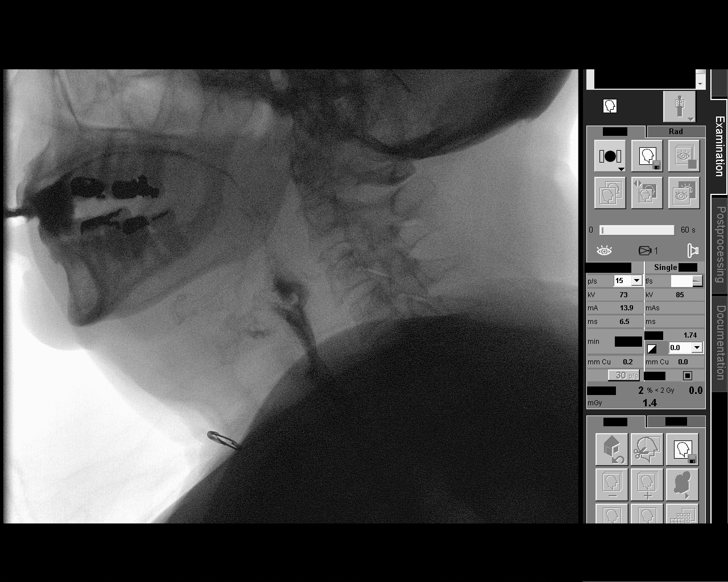
[frame 321/377]
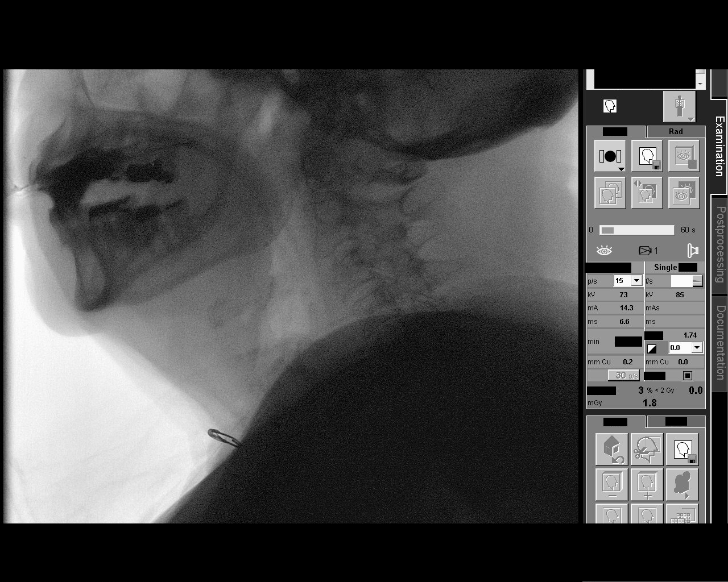

[Series 8: run · 1 of 83 frames shown (8 of 12)]
[frame 42/83]
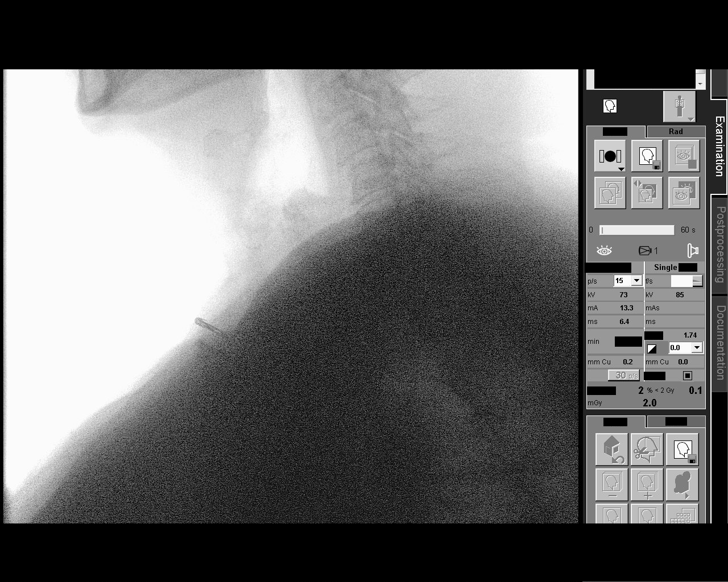

[Series 9: run · 1 of 7 frames shown (9 of 12)]
[frame 6/7]
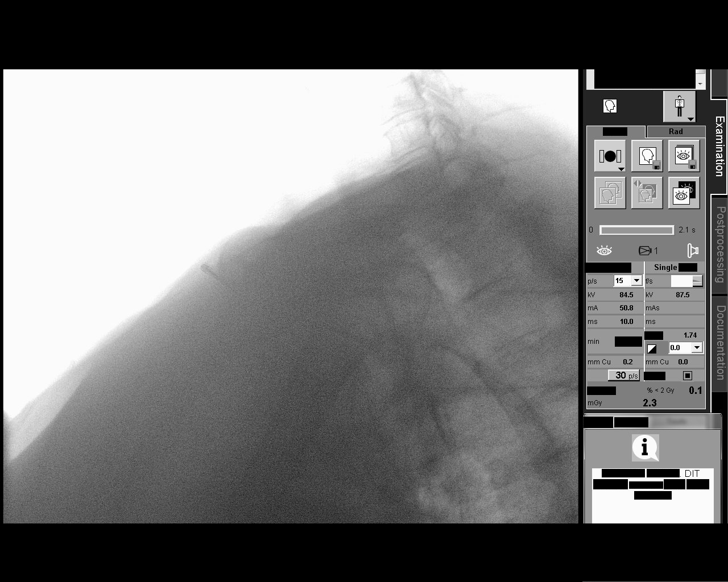

[Series 10: run · 1 of 14 frames shown (10 of 12)]
[frame 8/14]
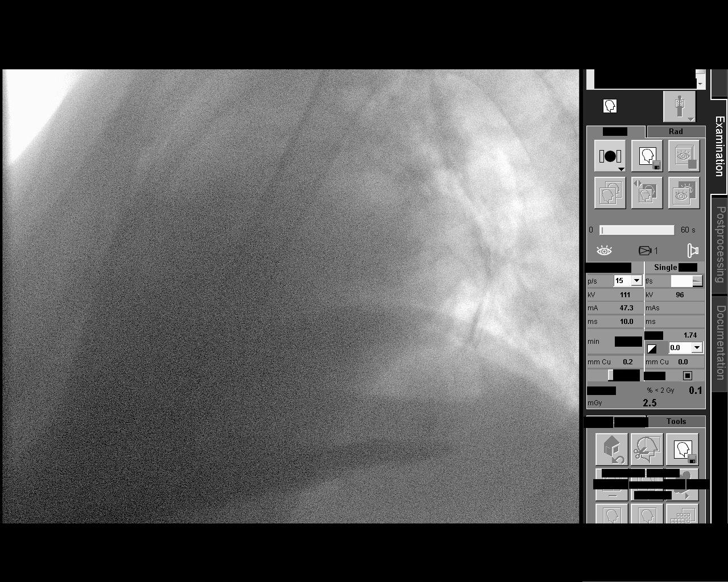

[Series 11: run · 1 of 358 frames shown (11 of 12)]
[frame 305/358]
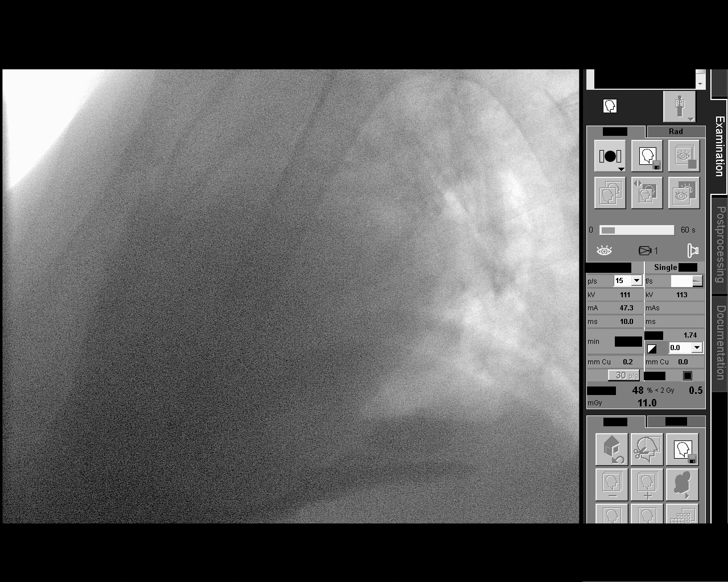

[Series 12: run · 1 of 7 frames shown (12 of 12)]
[frame 6/7]
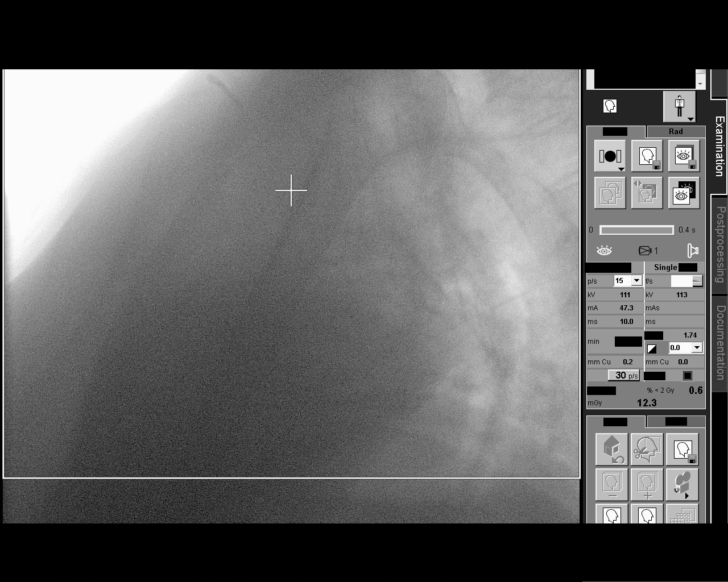

[13 of 24 positions shown; findings below may reference images not displayed]

FINDINGS: Thin liquid- within normal limits

Nectar thick liquid- within normal limits

Huert?Mannes within normal limits

Huert?Cesarin with cracker- within normal limits
IMPRESSION: Normal modified barium swallow examination. Survey view of a bolus
of puree revealed no obstruction to passage through the thoracic
esophagus.

Please refer to the Speech Pathologists report for complete details
and recommendations.

## 2021-12-19 ENCOUNTER — Emergency Department
Admission: EM | Admit: 2021-12-19 | Discharge: 2021-12-19 | Disposition: A | Discharge status: Home / Self Care | Payer: Medicare PPO | Attending: Emergency Medicine | Admitting: Emergency Medicine

## 2021-12-19 ENCOUNTER — Encounter: Payer: Self-pay | Admitting: Emergency Medicine

## 2021-12-19 ENCOUNTER — Other Ambulatory Visit: Payer: Self-pay

## 2021-12-19 ENCOUNTER — Emergency Department: Payer: Medicare PPO

## 2021-12-19 DIAGNOSIS — E119 Type 2 diabetes mellitus without complications: Secondary | ICD-10-CM | POA: Insufficient documentation

## 2021-12-19 DIAGNOSIS — W19XXXA Unspecified fall, initial encounter: Secondary | ICD-10-CM

## 2021-12-19 DIAGNOSIS — S22089A Unspecified fracture of T11-T12 vertebra, initial encounter for closed fracture: Secondary | ICD-10-CM

## 2021-12-19 DIAGNOSIS — I10 Essential (primary) hypertension: Secondary | ICD-10-CM | POA: Insufficient documentation

## 2021-12-19 DIAGNOSIS — E278 Other specified disorders of adrenal gland: Secondary | ICD-10-CM

## 2021-12-19 DIAGNOSIS — S32019A Unspecified fracture of first lumbar vertebra, initial encounter for closed fracture: Secondary | ICD-10-CM | POA: Diagnosis not present

## 2021-12-19 DIAGNOSIS — S3992XA Unspecified injury of lower back, initial encounter: Secondary | ICD-10-CM | POA: Diagnosis present

## 2021-12-19 DIAGNOSIS — R1084 Generalized abdominal pain: Secondary | ICD-10-CM

## 2021-12-19 LAB — COMPREHENSIVE METABOLIC PANEL
ALT: 11 U/L (ref 0–44)
AST: 18 U/L (ref 15–41)
Albumin: 3.8 g/dL (ref 3.5–5.0)
Alkaline Phosphatase: 123 U/L (ref 38–126)
Anion gap: 11 (ref 5–15)
BUN: 41 mg/dL — ABNORMAL HIGH (ref 8–23)
CO2: 27 mmol/L (ref 22–32)
Calcium: 8.8 mg/dL — ABNORMAL LOW (ref 8.9–10.3)
Chloride: 98 mmol/L (ref 98–111)
Creatinine, Ser: 1.2 mg/dL — ABNORMAL HIGH (ref 0.44–1.00)
GFR, Estimated: 46 mL/min — ABNORMAL LOW (ref >60)
Glucose, Bld: 404 mg/dL — ABNORMAL HIGH (ref 70–99)
Potassium: 3.9 mmol/L (ref 3.5–5.1)
Sodium: 136 mmol/L (ref 135–145)
Total Bilirubin: 1.1 mg/dL (ref 0.3–1.2)
Total Protein: 7.2 g/dL (ref 6.5–8.1)

## 2021-12-19 LAB — CBC
HCT: 38.1 % (ref 36.0–46.0)
Hemoglobin: 11.6 g/dL — ABNORMAL LOW (ref 12.0–15.0)
MCH: 23.9 pg — ABNORMAL LOW (ref 26.0–34.0)
MCHC: 30.4 g/dL (ref 30.0–36.0)
MCV: 78.4 fL — ABNORMAL LOW (ref 80.0–100.0)
Platelets: 320 10*3/uL (ref 150–400)
RBC: 4.86 MIL/uL (ref 3.87–5.11)
RDW: 15.6 % — ABNORMAL HIGH (ref 11.5–15.5)
WBC: 10.7 10*3/uL — ABNORMAL HIGH (ref 4.0–10.5)
nRBC: 0 % (ref 0.0–0.2)

## 2021-12-19 LAB — LIPASE, BLOOD: Lipase: 35 U/L (ref 11–51)

## 2021-12-19 LAB — CBG MONITORING, ED: Glucose-Capillary: 398 mg/dL — ABNORMAL HIGH (ref 70–99)

## 2021-12-19 MED ORDER — HYDROCODONE-ACETAMINOPHEN 5-325 MG PO TABS: 1.0000 | ORAL_TABLET | ORAL | 0 refills | Status: DC | PRN

## 2021-12-19 MED ORDER — ONDANSETRON 4 MG PO TBDP
4.0000 mg | ORAL_TABLET | Freq: Once | ORAL | Status: AC | PRN
  Administered 2021-12-19: 4 mg via ORAL
  Filled 2021-12-19: qty 1

## 2021-12-19 MED ORDER — MORPHINE SULFATE (PF) 4 MG/ML IV SOLN
4.0000 mg | Freq: Once | INTRAVENOUS | Status: AC
  Administered 2021-12-19: 4 mg via INTRAVENOUS
  Filled 2021-12-19: qty 1

## 2021-12-19 MED ORDER — SODIUM CHLORIDE 0.9 % IV SOLN
12.5000 mg | Freq: Once | INTRAVENOUS | Status: AC
  Administered 2021-12-19: 12.5 mg via INTRAVENOUS
  Filled 2021-12-19: qty 12.5

## 2021-12-19 MED ORDER — IOHEXOL 300 MG/ML  SOLN
80.0000 mL | Freq: Once | INTRAMUSCULAR | Status: AC | PRN
  Administered 2021-12-19: 80 mL via INTRAVENOUS

## 2021-12-19 MED ORDER — LACTATED RINGERS IV BOLUS
1000.0000 mL | Freq: Once | INTRAVENOUS | Status: AC
  Administered 2021-12-19: 1000 mL via INTRAVENOUS

## 2021-12-19 MED ORDER — LIDOCAINE 5 % EX PTCH: 1.0000 | MEDICATED_PATCH | Freq: Two times a day (BID) | CUTANEOUS | 23 refills | Status: DC

## 2021-12-19 NOTE — ED Notes (Signed)
Pt placed on 2L [place], desat to 87% after morphine administration. Dr Larinda Buttery notified

## 2021-12-19 NOTE — ED Notes (Signed)
TLSO brace applied.

## 2021-12-19 NOTE — ED Notes (Signed)
Pharmacy contacted for phenergan

## 2021-12-19 NOTE — ED Notes (Signed)
See triage note, pt reports upper abd pain and lower back pain. Abscess to mid back. +n/v

## 2021-12-19 NOTE — Progress Notes (Signed)
Orthopedic Tech Progress Note Patient Details:  Rebecca Ball [birth year] [id number]  Called in order of a TLSO BRACE to [facility].  Patient ID: [name], female   DOB: [birth year], 81 y.o.   MRN: [id number]  Georg Ruddle 12/19/2021, 3:03 PM

## 2021-12-19 NOTE — ED Provider Notes (Signed)
[facility] Provider Note    Event Date/Time   First MD Initiated Contact with Patient 12/19/21 1101     (approximate)   History   Chief Complaint Fall   HPI  Rebecca Ball is a 81 y.o. female with past medical history of hypertension, diabetes, and anemia presents to the ED complaining of abdominal pain.  Patient reports that she had a syncopal episode and a fall about 3 weeks ago, landing on her back.  She attributes this episode to low blood sugar, states she has had multiple similar episodes in the past and she denies any associated chest pain or shortness of breath.  She had minimal pain at the time of the fall, states that she woke up the next day with significant pain in the middle of her lower back.  She has had back issues in the past, but states pain has been worse since the fall.  She denies any numbness or weakness in her legs, has not had any difficulty urinating.  She does endorse increasing abdominal pain since the fall, primarily located in the right upper quadrant of her abdomen.  Her abdomen has seemed more distended and she has been dealing with nausea along with multiple episodes of vomiting.  She denies any constipation or diarrhea, has not had any urinary symptoms.     Physical Exam   Triage Vital Signs: ED Triage Vitals [12/19/21 1045]  Enc Vitals Group     BP (!) 153/71     Pulse Rate 96     Resp 18     Temp (!) 97.5 F (36.4 C)     Temp Source Oral     SpO2 96 %     Weight 200 lb (90.7 kg)     Height 5\' 5"  (1.651 m)     Head Circumference      Peak Flow      Pain Score 6     Pain Loc      Pain Edu?      Excl. in GC?     Most recent vital signs: Vitals:   12/19/21 1400 12/19/21 1500  BP: 136/65   Pulse: 73   Resp: 19   Temp:  98 F (36.7 C)  SpO2: 94%     Constitutional: Alert and oriented. Eyes: Conjunctivae are normal. Head: Atraumatic. Nose: No congestion/rhinnorhea. Mouth/Throat: Mucous membranes are  moist.  Neck: No midline cervical spine tenderness to palpation. Cardiovascular: Normal rate, regular rhythm. Grossly normal heart sounds.  2+ radial pulses bilaterally. Respiratory: Normal respiratory effort.  No retractions. Lungs CTAB. Gastrointestinal: Soft and mildly distended with diffuse tenderness. Musculoskeletal: No lower extremity tenderness nor edema.  Midline lumbar spinal tenderness to palpation.  No thoracic spinal tenderness to palpation.  Small draining abscess noted to left mid back. Neurologic:  Normal speech and language. No gross focal neurologic deficits are appreciated.    ED Results / Procedures / Treatments   Labs (all labs ordered are listed, but only abnormal results are displayed) Labs Reviewed  COMPREHENSIVE METABOLIC PANEL - Abnormal; Notable for the following components:      Result Value   Glucose, Bld 404 (*)    BUN 41 (*)    Creatinine, Ser 1.20 (*)    Calcium 8.8 (*)    GFR, Estimated 46 (*)    All other components within normal limits  CBC - Abnormal; Notable for the following components:   WBC 10.7 (*)    Hemoglobin 11.6 (*)  MCV 78.4 (*)    MCH 23.9 (*)    RDW 15.6 (*)    All other components within normal limits  CBG MONITORING, ED - Abnormal; Notable for the following components:   Glucose-Capillary 398 (*)    All other components within normal limits  LIPASE, BLOOD  URINALYSIS, ROUTINE W REFLEX MICROSCOPIC     EKG  ED ECG REPORT I, Chesley Noon, the attending physician, personally viewed and interpreted this ECG.   Date: 12/19/2021  EKG Time: 10:57  Rate: 88  Rhythm: normal sinus rhythm  Axis: Normal  Intervals:none  ST&T Change: None  RADIOLOGY CT of abdomen/pelvis reviewed and interpreted by me with no inflammatory changes, dilated bowel loops, or focal fluid collections.  PROCEDURES:  Critical Care performed: No  Procedures   MEDICATIONS ORDERED IN ED: Medications  ondansetron (ZOFRAN-ODT) disintegrating  tablet 4 mg (4 mg Oral Given 12/19/21 1121)  morphine (PF) 4 MG/ML injection 4 mg (4 mg Intravenous Given 12/19/21 1139)  lactated ringers bolus 1,000 mL (0 mLs Intravenous Stopped 12/19/21 1455)  iohexol (OMNIPAQUE) 300 MG/ML solution 80 mL (80 mLs Intravenous Contrast Given 12/19/21 1227)  promethazine (PHENERGAN) 12.5 mg in sodium chloride 0.9 % 50 mL IVPB (0 mg Intravenous Stopped 12/19/21 1455)     IMPRESSION / MDM / ASSESSMENT AND PLAN / ED COURSE  I reviewed the triage vital signs and the nursing notes.                              81 y.o. female with past medical history of hypertension, diabetes, and anemia who presents to the ED with increasing abdominal pain and pain in the middle of her lower back since a syncopal episode and fall about 3 weeks ago.  Patient's presentation is most consistent with acute presentation with potential threat to life or bodily function.  Differential diagnosis includes, but is not limited to, lumbar spinal fracture, lumbar contusion, intra-abdominal traumatic injury, cholecystitis, biliary colic, pancreatitis, hepatitis, appendicitis, bowel obstruction, UTI, kidney stone.  Patient nontoxic-appearing and in no acute distress, vital signs are unremarkable.  Her primary issue at this time seems to be increasing abdominal pain since a syncopal episode and fall.  Low suspicion for cardiac etiology for her syncope, no evidence of arrhythmia or ischemia noted on cardiac monitor and she denies any associated chest pain or shortness of breath, has had similar episodes in the past.  We will observe on cardiac monitor, further assess her abdominal and back pain with CT scan.  Labs and urinalysis are pending, we will treat symptomatically with IV morphine and reassess.  She does have a small draining abscess over her mid back that would likely benefit from course of antibiotics.  CT of abdomen/pelvis shows adrenal mass with possible small amount of hemorrhage, but unlikely to  be acute per radiology.  No other evidence of traumatic injury or other acute process to explain her abdominal pain.  She does have fractures of both T12 and L1 vertebra, likely explaining her pain.  Findings reviewed with Dr. Katrinka Blazing of neurosurgery, who agrees with plan for TLSO brace and outpatient follow-up.  She is appropriate for discharge home with PCP follow-up along with neurosurgery follow-up, was counseled on adrenal mass findings and need for follow-up imaging.  She will be prescribed a course of pain medication and was counseled to return to the ED for new or worsening symptoms, patient agrees with plan.  FINAL CLINICAL IMPRESSION(S) / ED DIAGNOSES   Final diagnoses:  Closed fracture of twelfth thoracic vertebra, unspecified fracture morphology, initial encounter ([place])  Closed fracture of first lumbar vertebra, unspecified fracture morphology, initial encounter ([place])  Fall, initial encounter  Generalized abdominal pain  Adrenal mass ([place])     Rx / DC Orders   ED Discharge Orders          Ordered    HYDROcodone-acetaminophen (NORCO/VICODIN) 5-325 MG tablet  Every 4 hours PRN        12/19/21 1529    lidocaine (LIDODERM) 5 %  Every 12 hours        12/19/21 1529             Note:  This document was prepared using Dragon voice recognition software and may include unintentional dictation errors.   Blake Divine, MD 12/19/21 1534

## 2021-12-19 NOTE — ED Triage Notes (Addendum)
Pt reports syncope two weeks ago.  C/o lower back pain from fall.  Pain to bilateral mid back.  Bruising noted to lower back and buttocks.  No blood thinners.  Pt has pain to RUQ on palpation.  Was vomiting about once per day but for past few days 4-5 times per day typically after eating. Does still have gallbladder.  Hx of kidney stones but not sure if this feels similar.  + chills, no known fever.  Bandaid over wound to left back; husband reports has been draining a yellow color and he has been trying to help it.  Wound is round outer edge with yellow inner ring and small black line in cen

## 2021-12-19 NOTE — ED Notes (Signed)
Placed on purewick

## 2021-12-19 NOTE — ED Notes (Signed)
[facility] ortho tech contacted for TLSO brace

## 2022-01-05 ENCOUNTER — Emergency Department: Payer: Medicare PPO

## 2022-01-05 ENCOUNTER — Encounter: Payer: Self-pay | Admitting: Emergency Medicine

## 2022-01-05 ENCOUNTER — Other Ambulatory Visit: Payer: Self-pay

## 2022-01-05 ENCOUNTER — Emergency Department
Admission: EM | Admit: 2022-01-05 | Discharge: 2022-01-05 | Disposition: A | Discharge status: Home / Self Care | Payer: Medicare PPO | Attending: Emergency Medicine | Admitting: Emergency Medicine

## 2022-01-05 DIAGNOSIS — M545 Low back pain, unspecified: Secondary | ICD-10-CM | POA: Diagnosis not present

## 2022-01-05 DIAGNOSIS — R109 Unspecified abdominal pain: Secondary | ICD-10-CM | POA: Diagnosis present

## 2022-01-05 DIAGNOSIS — R7309 Other abnormal glucose: Secondary | ICD-10-CM | POA: Insufficient documentation

## 2022-01-05 DIAGNOSIS — E278 Other specified disorders of adrenal gland: Secondary | ICD-10-CM | POA: Diagnosis not present

## 2022-01-05 DIAGNOSIS — R112 Nausea with vomiting, unspecified: Secondary | ICD-10-CM | POA: Diagnosis not present

## 2022-01-05 DIAGNOSIS — R944 Abnormal results of kidney function studies: Secondary | ICD-10-CM | POA: Diagnosis not present

## 2022-01-05 DIAGNOSIS — M546 Pain in thoracic spine: Secondary | ICD-10-CM | POA: Diagnosis not present

## 2022-01-05 DIAGNOSIS — I6782 Cerebral ischemia: Secondary | ICD-10-CM | POA: Diagnosis not present

## 2022-01-05 DIAGNOSIS — Z20822 Contact with and (suspected) exposure to covid-19: Secondary | ICD-10-CM | POA: Insufficient documentation

## 2022-01-05 DIAGNOSIS — R1011 Right upper quadrant pain: Secondary | ICD-10-CM

## 2022-01-05 DIAGNOSIS — M79621 Pain in right upper arm: Secondary | ICD-10-CM | POA: Insufficient documentation

## 2022-01-05 LAB — CBC WITH DIFFERENTIAL/PLATELET
Abs Immature Granulocytes: 0.04 10*3/uL (ref 0.00–0.07)
Basophils Absolute: 0.1 10*3/uL (ref 0.0–0.1)
Basophils Relative: 1 %
Eosinophils Absolute: 0.1 10*3/uL (ref 0.0–0.5)
Eosinophils Relative: 1 %
HCT: 39 % (ref 36.0–46.0)
Hemoglobin: 11.8 g/dL — ABNORMAL LOW (ref 12.0–15.0)
Immature Granulocytes: 0 %
Lymphocytes Relative: 12 %
Lymphs Abs: 1.3 10*3/uL (ref 0.7–4.0)
MCH: 24.2 pg — ABNORMAL LOW (ref 26.0–34.0)
MCHC: 30.3 g/dL (ref 30.0–36.0)
MCV: 80.1 fL (ref 80.0–100.0)
Monocytes Absolute: 0.3 10*3/uL (ref 0.1–1.0)
Monocytes Relative: 3 %
Neutro Abs: 9.1 10*3/uL — ABNORMAL HIGH (ref 1.7–7.7)
Neutrophils Relative %: 83 %
Platelets: 299 10*3/uL (ref 150–400)
RBC: 4.87 MIL/uL (ref 3.87–5.11)
RDW: 15 % (ref 11.5–15.5)
WBC: 11 10*3/uL — ABNORMAL HIGH (ref 4.0–10.5)
nRBC: 0 % (ref 0.0–0.2)

## 2022-01-05 LAB — COMPREHENSIVE METABOLIC PANEL
ALT: 14 U/L (ref 0–44)
AST: 26 U/L (ref 15–41)
Albumin: 3.6 g/dL (ref 3.5–5.0)
Alkaline Phosphatase: 95 U/L (ref 38–126)
Anion gap: 12 (ref 5–15)
BUN: 33 mg/dL — ABNORMAL HIGH (ref 8–23)
CO2: 28 mmol/L (ref 22–32)
Calcium: 9 mg/dL (ref 8.9–10.3)
Chloride: 100 mmol/L (ref 98–111)
Creatinine, Ser: 1.19 mg/dL — ABNORMAL HIGH (ref 0.44–1.00)
GFR, Estimated: 46 mL/min — ABNORMAL LOW (ref >60)
Glucose, Bld: 190 mg/dL — ABNORMAL HIGH (ref 70–99)
Potassium: 3.2 mmol/L — ABNORMAL LOW (ref 3.5–5.1)
Sodium: 140 mmol/L (ref 135–145)
Total Bilirubin: 1 mg/dL (ref 0.3–1.2)
Total Protein: 6.9 g/dL (ref 6.5–8.1)

## 2022-01-05 LAB — RESP PANEL BY RT-PCR (FLU A&B, COVID) ARPGX2
Influenza A by PCR: NEGATIVE
Influenza B by PCR: NEGATIVE
SARS Coronavirus 2 by RT PCR: NEGATIVE

## 2022-01-05 LAB — URINALYSIS, ROUTINE W REFLEX MICROSCOPIC
Bacteria, UA: NONE SEEN
Glucose, UA: NEGATIVE mg/dL
Hgb urine dipstick: NEGATIVE
Ketones, ur: 40 mg/dL — AB
Leukocytes,Ua: NEGATIVE
Nitrite: NEGATIVE
Protein, ur: NEGATIVE mg/dL
Specific Gravity, Urine: <1.005 — ABNORMAL LOW (ref 1.005–1.030)
pH: 5.5 (ref 5.0–8.0)

## 2022-01-05 LAB — CBG MONITORING, ED: Glucose-Capillary: 191 mg/dL — ABNORMAL HIGH (ref 70–99)

## 2022-01-05 LAB — LIPASE, BLOOD: Lipase: 30 U/L (ref 11–51)

## 2022-01-05 MED ORDER — HYDROCODONE-ACETAMINOPHEN 5-325 MG PO TABS: 1.0000 | ORAL_TABLET | ORAL | 0 refills | Status: DC | PRN

## 2022-01-05 MED ORDER — METOCLOPRAMIDE HCL 10 MG PO TABS: 10.0000 mg | ORAL_TABLET | Freq: Three times a day (TID) | ORAL | 1 refills | Status: DC

## 2022-01-05 MED ORDER — METOCLOPRAMIDE HCL 5 MG/ML IJ SOLN
10.0000 mg | Freq: Once | INTRAMUSCULAR | Status: AC
  Administered 2022-01-05: 10 mg via INTRAVENOUS
  Filled 2022-01-05: qty 2

## 2022-01-05 MED ORDER — ONDANSETRON 4 MG PO TBDP: 4.0000 mg | ORAL_TABLET | Freq: Once | ORAL | Status: DC

## 2022-01-05 MED ORDER — ONDANSETRON 4 MG PO TBDP
4.0000 mg | ORAL_TABLET | Freq: Once | ORAL | Status: AC
  Administered 2022-01-05: 4 mg via ORAL
  Filled 2022-01-05: qty 1

## 2022-01-05 MED ORDER — HYOSCYAMINE SULFATE 0.125 MG PO TBDP
0.1250 mg | ORAL_TABLET | Freq: Once | ORAL | Status: AC
  Administered 2022-01-05: 0.125 mg via ORAL
  Filled 2022-01-05: qty 1

## 2022-01-05 MED ORDER — SODIUM CHLORIDE 0.9 % IV BOLUS
1000.0000 mL | Freq: Once | INTRAVENOUS | Status: AC
  Administered 2022-01-05: 1000 mL via INTRAVENOUS

## 2022-01-05 MED ORDER — IOHEXOL 300 MG/ML  SOLN
100.0000 mL | Freq: Once | INTRAMUSCULAR | Status: AC | PRN
  Administered 2022-01-05: 100 mL via INTRAVENOUS

## 2022-01-05 MED ORDER — ONDANSETRON HCL 4 MG/2ML IJ SOLN
4.0000 mg | Freq: Once | INTRAMUSCULAR | Status: AC
  Administered 2022-01-05: 4 mg via INTRAVENOUS
  Filled 2022-01-05: qty 2

## 2022-01-05 MED ORDER — ONDANSETRON 4 MG PO TBDP: 4.0000 mg | ORAL_TABLET | Freq: Three times a day (TID) | ORAL | 0 refills | Status: DC | PRN

## 2022-01-05 NOTE — ED Notes (Signed)
This tech sent urine from pt down to lab.

## 2022-01-05 NOTE — ED Provider Triage Note (Signed)
Emergency Medicine Provider Triage Evaluation Note  Rebecca Ball, a 81 y.o. female  was evaluated in triage.  Pt complains of abdominal pain with nausea and vomiting as well as assistant back pain following a mechanical fall last month.  Patient has noted several days of intermittent nausea with vomiting.  She denies any fevers, chills, sweats, diarrhea.  Patient gives report of lumbar compression fractures from her last ED evaluation.  She presents to the ED from [facility], with reports of "low blood sugar."    Review of Systems  Positive: NV, abd pain, back pain Negative: CP, SOB  Physical Exam  There were no vitals taken for this visit. Gen:   Awake, no distress  NAD Resp:  Normal effort CTA MSK:   Moves extremities without difficulty  Other:  Soft, nontender  Medical Decision Making  Medically screening exam initiated at 11:21 AM.  Appropriate orders placed.  HALIEGH KHURANA was informed that the remainder of the evaluation will be completed by another provider, this initial triage assessment does not replace that evaluation, and the importance of remaining in the ED until their evaluation is complete.  Geriatric patient to the ED with reports of abdominal pain and nausea vomiting for the last week.  She also reports an ongoing back pain following mechanical fall last month.  She presents to the ED from [facility] with reports of low blood sugar.   Melvenia Needles, PA-C 01/05/22 1126

## 2022-01-05 NOTE — Discharge Instructions (Signed)
Please call tomorrow to schedule follow-up appointment with Dr. Dahlia Byes and Dr. Virgina Jock.  Take the medication as prescribed.  If your symptoms change or worsen and you are unable to see primary care or the specialist, please return to the emergency department.

## 2022-01-05 NOTE — ED Provider Notes (Signed)
[facility] Provider Note    Event Date/Time   First MD Initiated Contact with Patient 01/05/22 1520     (approximate)   History   Nausea   HPI  Rebecca Ball is a 81 y.o. female presents to the emergency department for treatment and evaluation of persistent nausea with abdominal pain and pressure that has been ongoing for "a couple of years" but seems to have gotten worse over the past couple of weeks.  She also fell yesterday but relates that to a drop in her blood sugar.  She does state that she hit her forehead but denies loss of consciousness and was aware of what was happening.  She was able to get up with some assistance.  Symptoms resolved after peanut butter and orange juice.  She was evaluated here for similar symptoms after a fall couple of weeks ago.  No specific reason for the abdominal pain with nausea and vomiting was found, however she was diagnosed with a fracture in her back.  With her most recent fall, she has had no change in location of the pain and no weakness in the lower extremities.  She does have some pain in the right upper arm.      Physical Exam   Triage Vital Signs: ED Triage Vitals  Enc Vitals Group     BP 01/05/22 1121 118/63     Pulse Rate 01/05/22 1121 88     Resp 01/05/22 1121 17     Temp 01/05/22 1121 98.6 F (37 C)     Temp Source 01/05/22 1121 Oral     SpO2 01/05/22 1121 97 %     Weight 01/05/22 1135 191 lb (86.6 kg)     Height 01/05/22 1135 5\' 5"  (1.651 m)     Head Circumference --      Peak Flow --      Pain Score 01/05/22 1135 7     Pain Loc --      Pain Edu? --      Excl. in GC? --     Most recent vital signs: Vitals:   01/05/22 1630 01/05/22 1926  BP: (!) 122/50 (!) 130/54  Pulse: 84 86  Resp:  20  Temp:  98.2 F (36.8 C)  SpO2: 95% 96%     General: Awake, no distress.  CV:  Good peripheral perfusion.  Resp:  Normal effort.  Abd:  Soft, slightly distended.  Tender to palpation over the  epigastric and right upper quadrant. Other:  Diffuse tenderness along the right humerus without focal tenderness in the shoulder or elbow.  Diffuse tenderness along the lower thoracic and lumbar spine.  Equal strength in the lower extremities.   ED Results / Procedures / Treatments   Labs (all labs ordered are listed, but only abnormal results are displayed) Labs Reviewed  CBC WITH DIFFERENTIAL/PLATELET - Abnormal; Notable for the following components:      Result Value   WBC 11.0 (*)    Hemoglobin 11.8 (*)    MCH 24.2 (*)    Neutro Abs 9.1 (*)    All other components within normal limits  COMPREHENSIVE METABOLIC PANEL - Abnormal; Notable for the following components:   Potassium 3.2 (*)    Glucose, Bld 190 (*)    BUN 33 (*)    Creatinine, Ser 1.19 (*)    GFR, Estimated 46 (*)    All other components within normal limits  URINALYSIS, ROUTINE W REFLEX MICROSCOPIC - Abnormal;  Notable for the following components:   Color, Urine YELLOW (*)    APPearance CLEAR (*)    Specific Gravity, Urine <1.005 (*)    Bilirubin Urine LARGE (*)    Ketones, ur 40 (*)    All other components within normal limits  CBG MONITORING, ED - Abnormal; Notable for the following components:   Glucose-Capillary 191 (*)    All other components within normal limits  RESP PANEL BY RT-PCR (FLU A&B, COVID) ARPGX2  LIPASE, BLOOD     EKG  Normal sinus rhythm, ventricular rate of 89.  Unchanged from previous   RADIOLOGY Ultrasound images reviewed and interpreted by me: No obvious acute findings of the right upper quadrant ultrasound Ultrasound image of the right upper quadrant negative for acute concern per radiology.  Image of the right humerus negative for acute findings.   Image reviewed and interpreted by me and radiology report reviewed.  CT abdomen and pelvis without contrast shows right adrenal mass measuring approximately 4.6 x 2.3 cm.  Unable to rule out malignancy.   PROCEDURES:  Critical  Care performed: No  Procedures   MEDICATIONS ORDERED IN ED: Medications  ondansetron (ZOFRAN-ODT) disintegrating tablet 4 mg (4 mg Oral Given 01/05/22 1145)  metoCLOPramide (REGLAN) injection 10 mg (10 mg Intravenous Given 01/05/22 1623)  sodium chloride 0.9 % bolus 1,000 mL (1,000 mLs Intravenous New Bag/Given 01/05/22 1623)  iohexol (OMNIPAQUE) 300 MG/ML solution 100 mL (100 mLs Intravenous Contrast Given 01/05/22 1803)  hyoscyamine (ANASPAZ) disintergrating tablet 0.125 mg (0.125 mg Oral Given 01/05/22 1844)  ondansetron (ZOFRAN) injection 4 mg (4 mg Intravenous Given 01/05/22 1829)     IMPRESSION / MDM / ASSESSMENT AND PLAN / ED COURSE  I reviewed the triage vital signs and the nursing notes.                              Differential diagnosis includes, but is not limited to, acute cholecystitis, cholelithiasis, intra-abdominal pathology, bowel obstruction, acute cystitis, intracranial hemorrhage, compression fracture of the vertebral lumbar spine  Patient's presentation is most consistent with acute presentation with potential threat to life or bodily function.  81 year old female presenting to the emergency department for treatment and evaluation of persistent abdominal pain and vomiting.  See HPI for further details she was evaluated here a couple of weeks ago for the abdominal pain with nausea and vomiting in addition to a fall.  At that time she was diagnosed with a vertebral fracture and placed in a TLSO.  She was also found to have a an adrenal mass with possible small amount of hemorrhage, but no specific reason for the nausea and vomiting.  Labs are reassuring.  Lipase is normal, mild hypokalemia which appears to be at or near baseline.  BUN and creatinine are slightly elevated but also appear to be improved from 2 weeks ago.  Influenza and COVID testing are negative.  Clinical Course as of 01/05/22 2031  Thu Jan 05, 2022  1816 [place] negative for acute concerns.  Lab and [place]  results discussed with the patient and family. Plan will be to get CT abdomen and pelvis since abdominal pain has worsened over the past couple of weeks and nausea has continued.  She continues to complain of nausea, but states is has improved some. Zofran IV ordered. [CT]  1956 CT results discussed with Dr. Hampton Abbot, general surgeon on-call.  Plan will be to have her follow-up on an outpatient basis  for lab studies and further evaluation of adrenal mass on the right side. [CT]  2029 Urinalysis is overall reassuring.  She does have ketones and bilirubin.  She received fluids here and has been able to tolerate p.o. fluids while waiting on results.  Nausea is well controlled at this time.  Plan will be to have her follow-up with surgery as well as GI.  Patient and has been aware and agreeable to the plan.  Prescriptions for Reglan and Zofran submitted to the patient's pharmacy. 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   Chinita Pester, FNP 01/05/22 2031    Chesley Noon, MD 01/06/22 [phone]

## 2022-01-05 NOTE — ED Notes (Signed)
Provider CBT states refill of pain med sent to pt's pharm; pt notified.

## 2022-01-05 NOTE — ED Triage Notes (Signed)
Pt to ER with c/o nausea.  States had a fall approximately 1 month ago and has had nausea since then.  States had a second fall last night and had to have help getting up.  Reports "extremely low blood sugar" while EMS was there.  States vomited most of the night and continues with nausea this AM.  Pt reports abdominal pain and continued back pain from previous fall.

## 2022-01-05 NOTE — ED Notes (Signed)
Pt requesting prescription of pain med for her back pain. Notified provider CBT of pt's request.

## 2022-01-09 NOTE — Progress Notes (Unsigned)
Referring Physician:  Blake Divine, MD [place],  [place] [address]  Primary Physician:  Kirk Ruths, MD  History of Present Illness:  Seen in ED on 12/19/21 for back pain s/p fall 3 weeks prior. Found to have fractures of T12 and L1. She was placed in TLSO brace and is here for follow up.   She was seen back in ED on 01/05/22 for nausea and abdominal pain/pressure along with fall on 01/04/22. She has follow up with general surgery for adrenal mass.   She is here for follow up of her T12 and L1 fractures that are likely 20ish weeks old.   Do anything for Subacute transverse process fractures of L1 through L4 on the left?***   01/09/2022 Ms. Charlean Merl Pun is here today with a chief complaint of ***  Duration: *** Location: *** Quality: *** Severity: ***  Precipitating: aggravated by *** Modifying factors: made better by *** Weakness: none Timing: *** Bowel/Bladder Dysfunction: none  Conservative measures:  Physical therapy: ***  Multimodal medical therapy including regular antiinflammatories: ***  Injections: *** epidural steroid injections  Past Surgery: ***  Rayvn R Asquith has ***no symptoms of cervical myelopathy.  The symptoms are causing a significant impact on the patient's life.   Review of Systems:  A 10 point review of systems is negative, except for the pertinent positives and negatives detailed in the HPI.  Past Medical History: Past Medical History:  Diagnosis Date   Diabetes mellitus without complication ([place])    Hypertension     Past Surgical History: Past Surgical History:  Procedure Laterality Date   IRRIGATION AND DEBRIDEMENT ABSCESS Right 07/05/2017   Procedure: IRRIGATION AND DEBRIDEMENT GLUTEAL ABSCESS;  Surgeon: Olean Ree, MD;  Location: [facility] ORS;  Service: General;  Laterality: Right;    Allergies: Allergies as of 01/10/2022 - Review Complete 01/05/2022  Allergen Reaction Noted   Bactrim  [sulfamethoxazole-trimethoprim] Rash 08/07/2017   Penicillins Anaphylaxis 07/02/2017   Oxycodone Nausea And Vomiting 07/02/2017   Propoxyphene Nausea And Vomiting 02/26/2014    Medications: Outpatient Encounter Medications as of 01/10/2022  Medication Sig   amLODipine (NORVASC) 5 MG tablet    aspirin 81 MG chewable tablet Chew 81 mg by mouth daily.   enalapril (VASOTEC) 20 MG tablet    furosemide (LASIX) 40 MG tablet Take by mouth.   hydrochlorothiazide (HYDRODIURIL) 25 MG tablet Take 25 mg by mouth daily.   HYDROcodone-acetaminophen (NORCO/VICODIN) 5-325 MG tablet Take 1 tablet by mouth every 4 (four) hours as needed for moderate pain.   insulin aspart (NOVOLOG) 100 UNIT/ML injection Inject into the skin 3 (three) times daily before meals.   insulin glargine (LANTUS) 100 UNIT/ML injection Inject 75 Units into the skin every morning.   Insulin Pen Needle (PEN NEEDLES 31GX5/16") 31G X 8 MM MISC Use 1 Syringe 5 (five) times daily.   lidocaine (LIDODERM) 5 % Place 1 patch onto the skin every 12 (twelve) hours. Remove & Discard patch within 12 hours or as directed by MD   metFORMIN (GLUCOPHAGE) 1000 MG tablet Take 1,000 mg by mouth 2 (two) times daily with a meal.   metoCLOPramide (REGLAN) 10 MG tablet Take 1 tablet (10 mg total) by mouth 3 (three) times daily with meals.   metoprolol succinate (TOPROL XL) 25 MG 24 hr tablet Take 25 mg by mouth daily.   nystatin-triamcinolone ointment (MYCOLOG) Apply 1 application topically 2 (two) times daily.   ondansetron (ZOFRAN-ODT) 4 MG disintegrating tablet Take 1 tablet (4  mg total) by mouth every 8 (eight) hours as needed for nausea or vomiting.   potassium chloride (K-DUR) 10 MEQ tablet    PREVNAR 13 SUSP injection    simvastatin (ZOCOR) 20 MG tablet Take 20 mg by mouth daily.   sulfamethoxazole-trimethoprim (BACTRIM DS,SEPTRA DS) 800-160 MG tablet    sulfamethoxazole-trimethoprim (BACTRIM DS,SEPTRA DS) 800-160 MG tablet Take 1 tablet by mouth 2  (two) times daily.   TUBERCULIN SYR 1CC/25GX5/8" 25G X 5/8" 1 ML MISC Use as directed.   No facility-administered encounter medications on file as of 01/10/2022.    Social History: Social History   Tobacco Use   Smoking status: Former   Smokeless tobacco: Never  Substance Use Topics   Alcohol use: Not Currently   Drug use: Never    Family Medical History: Family History  Problem Relation Age of Onset   Breast cancer Paternal Aunt     Physical Examination: There were no vitals filed for this visit.  General: Patient is well developed, well nourished, calm, collected, and in no apparent distress. Attention to examination is appropriate.  Respiratory: Patient is breathing without any difficulty.   NEUROLOGICAL:     Awake, alert, oriented to person, place, and time.  Speech is clear and fluent. Fund of knowledge is appropriate.   Cranial Nerves: Pupils equal round and reactive to light.  Facial tone is symmetric.  Facial sensation is symmetric.  ROM of lumbar spine not tested.   No abnormal lesions on exposed skin.   Strength: Side Biceps Triceps Deltoid Interossei Grip Wrist Ext. Wrist Flex.  R 5 5 5 5 5 5 5   L 5 5 5 5 5 5 5    Side Iliopsoas Quads Hamstring PF DF EHL  R 5 5 5 5 5 5   L 5 5 5 5 5 5    Reflexes are ***2+ and symmetric at the biceps, triceps, brachioradialis, patella and achilles.   Hoffman's is absent.  Clonus is not present.   Bilateral upper and lower extremity sensation is intact to light touch.    No evidence of dysmetria noted.  Gait is normal.   ***No difficulty with tandem gait.    Medical Decision Making  Imaging: CT of lumbar spine 01/05/22:  FINDINGS: Segmentation: 5 lumbar type vertebrae as previously established   Alignment: Anterolisthesis at L4-5 and L5-S1.  Mild scoliosis.   Vertebrae: Subacute fractures through the T12 inferior endplate and anterior inferior osteophyte and through the anterior and superior osteophyte of L1  and the adjacent endplate. No interval height loss. Gas is seen within the fracture cleft of L1. Transverse process fractures on the left at L1-L4.   Remote L3 superior endplate fracture.   Bridging osteophytes from T8-T12.   Paraspinal and other soft tissues: Subcutaneous swelling the level of L1.   Disc levels:   T12- L1: Despite the adjacent fractures there is vacuum phenomenon within the collapsed disc space. Moderate right foraminal narrowing.   L1-L2: Disc collapse with gas containing cleft. Endplate spurring and retrolisthesis causing right more than left foraminal impingement.   L2-L3: Disc narrowing with endplate and facet spurring bilaterally. Left more than right foraminal impingement   L3-L4: Disc narrowing and bilateral facet spurring.   L4-L5: Advanced facet osteoarthritis with spurring and anterolisthesis. Disc space narrowing and at least moderate bilateral foraminal stenosis. Advanced spinal stenosis   L5-S1:Facet osteoarthritis with spurring and mild anterolisthesis.   IMPRESSION: 1. Unchanged alignment of subacute T12 and L1 vertebral body and osteophyte fractures. Subacute transverse  process fractures of L1 through L4 on the left. 2. 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 Spinal stenosis is advanced at L4-5. 3. Bridging osteophytes from T8-T12 at least.  I have personally reviewed the images and agree with the above interpretation.  Assessment and Plan: Ms. Eble is a pleasant 81 y.o. female with ***  Above treatment options discussed with patient and following plan made:   - Order for physical therapy for *** spine ***. - Continue on current medications including ***. Reviewed proper dosing along with risks and benefits. Take and NSAIDs with food.      I spent a total of *** minutes in face-to-face and non-face-to-face activities related to this patient's care today.  Thank you for involving me in the  care of this patient.   Drake Leach PA-C Dept. of Neurosurgery

## 2022-01-10 ENCOUNTER — Ambulatory Visit: Payer: Medicare PPO | Admitting: Orthopedic Surgery

## 2022-01-10 ENCOUNTER — Encounter: Payer: Self-pay | Admitting: Orthopedic Surgery

## 2022-01-10 VITALS — BP 124/64 | Ht 65.0 in | Wt 191.0 lb

## 2022-01-10 DIAGNOSIS — S32010A Wedge compression fracture of first lumbar vertebra, initial encounter for closed fracture: Secondary | ICD-10-CM | POA: Diagnosis not present

## 2022-01-10 DIAGNOSIS — M47816 Spondylosis without myelopathy or radiculopathy, lumbar region: Secondary | ICD-10-CM

## 2022-01-10 DIAGNOSIS — S22080A Wedge compression fracture of T11-T12 vertebra, initial encounter for closed fracture: Secondary | ICD-10-CM | POA: Diagnosis not present

## 2022-01-10 DIAGNOSIS — M5136 Other intervertebral disc degeneration, lumbar region: Secondary | ICD-10-CM | POA: Diagnosis not present

## 2022-01-10 MED ORDER — TRAMADOL HCL 50 MG PO TABS: 50.0000 mg | ORAL_TABLET | Freq: Two times a day (BID) | ORAL | 0 refills | Status: DC | PRN

## 2022-01-10 NOTE — Patient Instructions (Signed)
It was so nice to see you today, I am sorry that you are hurting so much.   You have arthritis and wear and tear in your lower back. You may also have new broken bones (compression fractures) in your back. Your pain may be coming from either of these.   Pain in your right hip/groin is likely from hip arthritis.   I want to get an MRI of your lower back to look into things further. We will call you to set this up.   I sent a limited amount of tramadol to your pharmacy to help with severe pain. Take only as needed. This can make you sleepy and constipated.   I still recommend you wear your back brace. No bending, twisting, or lifting.   Will call you to set up phone call visit to review the MRI results.   If you have not heard back about any of the tests/procedures in the next week, please call the office so we can help you get these things scheduled.   Geronimo Boot PA-C [phone]

## 2022-01-18 ENCOUNTER — Emergency Department: Payer: Medicare PPO

## 2022-01-18 ENCOUNTER — Observation Stay
Admission: EM | Admit: 2022-01-18 | Discharge: 2022-01-20 | Disposition: A | Discharge status: Home / Self Care | Payer: Medicare PPO | Attending: Internal Medicine | Admitting: Internal Medicine

## 2022-01-18 DIAGNOSIS — Z885 Allergy status to narcotic agent status: Secondary | ICD-10-CM

## 2022-01-18 DIAGNOSIS — G8929 Other chronic pain: Secondary | ICD-10-CM | POA: Diagnosis present

## 2022-01-18 DIAGNOSIS — I10 Essential (primary) hypertension: Secondary | ICD-10-CM | POA: Insufficient documentation

## 2022-01-18 DIAGNOSIS — Z7984 Long term (current) use of oral hypoglycemic drugs: Secondary | ICD-10-CM

## 2022-01-18 DIAGNOSIS — K6289 Other specified diseases of anus and rectum: Secondary | ICD-10-CM | POA: Diagnosis not present

## 2022-01-18 DIAGNOSIS — Z888 Allergy status to other drugs, medicaments and biological substances status: Secondary | ICD-10-CM

## 2022-01-18 DIAGNOSIS — E1165 Type 2 diabetes mellitus with hyperglycemia: Secondary | ICD-10-CM | POA: Insufficient documentation

## 2022-01-18 DIAGNOSIS — Z79899 Other long term (current) drug therapy: Secondary | ICD-10-CM

## 2022-01-18 DIAGNOSIS — Z9181 History of falling: Secondary | ICD-10-CM

## 2022-01-18 DIAGNOSIS — R296 Repeated falls: Secondary | ICD-10-CM | POA: Diagnosis present

## 2022-01-18 DIAGNOSIS — Z880 Allergy status to penicillin: Secondary | ICD-10-CM | POA: Diagnosis not present

## 2022-01-18 DIAGNOSIS — E278 Other specified disorders of adrenal gland: Secondary | ICD-10-CM

## 2022-01-18 DIAGNOSIS — K5900 Constipation, unspecified: Secondary | ICD-10-CM | POA: Insufficient documentation

## 2022-01-18 DIAGNOSIS — Z87891 Personal history of nicotine dependence: Secondary | ICD-10-CM

## 2022-01-18 DIAGNOSIS — E669 Obesity, unspecified: Secondary | ICD-10-CM | POA: Diagnosis present

## 2022-01-18 DIAGNOSIS — Z7982 Long term (current) use of aspirin: Secondary | ICD-10-CM

## 2022-01-18 DIAGNOSIS — E279 Disorder of adrenal gland, unspecified: Secondary | ICD-10-CM | POA: Diagnosis present

## 2022-01-18 DIAGNOSIS — T402X5A Adverse effect of other opioids, initial encounter: Secondary | ICD-10-CM | POA: Diagnosis present

## 2022-01-18 DIAGNOSIS — Z794 Long term (current) use of insulin: Secondary | ICD-10-CM | POA: Diagnosis not present

## 2022-01-18 DIAGNOSIS — R109 Unspecified abdominal pain: Secondary | ICD-10-CM | POA: Diagnosis present

## 2022-01-18 DIAGNOSIS — K5641 Fecal impaction: Secondary | ICD-10-CM | POA: Diagnosis present

## 2022-01-18 DIAGNOSIS — Z6834 Body mass index (BMI) 34.0-34.9, adult: Secondary | ICD-10-CM | POA: Diagnosis not present

## 2022-01-18 DIAGNOSIS — M5442 Lumbago with sciatica, left side: Secondary | ICD-10-CM | POA: Insufficient documentation

## 2022-01-18 DIAGNOSIS — K5903 Drug induced constipation: Secondary | ICD-10-CM | POA: Diagnosis present

## 2022-01-18 LAB — COMPREHENSIVE METABOLIC PANEL
ALT: 14 U/L (ref 0–44)
AST: 24 U/L (ref 15–41)
Albumin: 3.2 g/dL — ABNORMAL LOW (ref 3.5–5.0)
Alkaline Phosphatase: 112 U/L (ref 38–126)
Anion gap: 9 (ref 5–15)
BUN: 22 mg/dL (ref 8–23)
CO2: 26 mmol/L (ref 22–32)
Calcium: 8.7 mg/dL — ABNORMAL LOW (ref 8.9–10.3)
Chloride: 102 mmol/L (ref 98–111)
Creatinine, Ser: 0.97 mg/dL (ref 0.44–1.00)
GFR, Estimated: 59 mL/min — ABNORMAL LOW (ref >60)
Glucose, Bld: 480 mg/dL — ABNORMAL HIGH (ref 70–99)
Potassium: 4 mmol/L (ref 3.5–5.1)
Sodium: 137 mmol/L (ref 135–145)
Total Bilirubin: 0.8 mg/dL (ref 0.3–1.2)
Total Protein: 6.5 g/dL (ref 6.5–8.1)

## 2022-01-18 LAB — CBC WITH DIFFERENTIAL/PLATELET
Abs Immature Granulocytes: 0.02 10*3/uL (ref 0.00–0.07)
Basophils Absolute: 0.1 10*3/uL (ref 0.0–0.1)
Basophils Relative: 1 %
Eosinophils Absolute: 0.1 10*3/uL (ref 0.0–0.5)
Eosinophils Relative: 1 %
HCT: 36.4 % (ref 36.0–46.0)
Hemoglobin: 10.9 g/dL — ABNORMAL LOW (ref 12.0–15.0)
Immature Granulocytes: 0 %
Lymphocytes Relative: 12 %
Lymphs Abs: 1.2 10*3/uL (ref 0.7–4.0)
MCH: 23.9 pg — ABNORMAL LOW (ref 26.0–34.0)
MCHC: 29.9 g/dL — ABNORMAL LOW (ref 30.0–36.0)
MCV: 79.8 fL — ABNORMAL LOW (ref 80.0–100.0)
Monocytes Absolute: 0.5 10*3/uL (ref 0.1–1.0)
Monocytes Relative: 5 %
Neutro Abs: 8.3 10*3/uL — ABNORMAL HIGH (ref 1.7–7.7)
Neutrophils Relative %: 81 %
Platelets: 320 10*3/uL (ref 150–400)
RBC: 4.56 MIL/uL (ref 3.87–5.11)
RDW: 14.3 % (ref 11.5–15.5)
WBC: 10.1 10*3/uL (ref 4.0–10.5)
nRBC: 0 % (ref 0.0–0.2)

## 2022-01-18 LAB — LIPASE, BLOOD: Lipase: 43 U/L (ref 11–51)

## 2022-01-18 MED ORDER — SODIUM CHLORIDE 0.9 % IV BOLUS
1000.0000 mL | Freq: Once | INTRAVENOUS | Status: AC
  Administered 2022-01-18: 1000 mL via INTRAVENOUS

## 2022-01-18 MED ORDER — POLYETHYLENE GLYCOL 3350 17 G PO PACK
17.0000 g | PACK | Freq: Two times a day (BID) | ORAL | Status: DC
  Administered 2022-01-19 – 2022-01-20 (×3): 17 g via ORAL
  Filled 2022-01-18 (×3): qty 1

## 2022-01-18 MED ORDER — ONDANSETRON HCL 4 MG/2ML IJ SOLN
4.0000 mg | Freq: Once | INTRAMUSCULAR | Status: AC
  Administered 2022-01-18: 4 mg via INTRAVENOUS
  Filled 2022-01-18: qty 2

## 2022-01-18 MED ORDER — SODIUM CHLORIDE 0.9 % IV SOLN
1.0000 g | Freq: Once | INTRAVENOUS | Status: AC
  Administered 2022-01-18: 1 g via INTRAVENOUS
  Filled 2022-01-18: qty 10

## 2022-01-18 MED ORDER — METRONIDAZOLE 500 MG/100ML IV SOLN
500.0000 mg | Freq: Once | INTRAVENOUS | Status: AC
  Administered 2022-01-18: 500 mg via INTRAVENOUS
  Filled 2022-01-18: qty 100

## 2022-01-18 MED ORDER — IOHEXOL 300 MG/ML  SOLN
100.0000 mL | Freq: Once | INTRAMUSCULAR | Status: AC | PRN
  Administered 2022-01-18: 100 mL via INTRAVENOUS

## 2022-01-18 NOTE — Assessment & Plan Note (Signed)
A stable 4.6 cm x 2.3 cm lobulated right adrenal mass is seen. A stable 2.1 cm x 1.7 cm low-attenuation left adrenal mass is also noted Patient has been referred to surgery outpatient

## 2022-01-18 NOTE — Assessment & Plan Note (Addendum)
Constipation, suspect opiate-induced Patient was disimpacted in the ED Continue Rocephin and Flagyl Daily MiraLAX and Colace with Dulcolax as needed Minimize opiates if at all possible

## 2022-01-18 NOTE — Assessment & Plan Note (Signed)
BP controlled. Continue metoprolol and enalapril pending med rec

## 2022-01-18 NOTE — Assessment & Plan Note (Signed)
Blood sugar 480 Continue basal insulin with sliding scale insulin coverage

## 2022-01-18 NOTE — H&P (Signed)
History and Physical    Patient: Rebecca Ball [id number] DOB: April 08, 1941 DOA: 01/18/2022 DOS: the patient was seen and examined on 01/18/2022 PCP: Kirk Ruths, MD  Patient coming from: Home  Chief Complaint:  Chief Complaint  Patient presents with   Constipation    Pt bib ems for abd pain, n/v, and constipation. States she hasn't had bowel movement in 4 days.    Abdominal Pain    HPI: Rebecca Ball is a 81 y.o. female with medical history significant for Insulin-dependent type 2 diabetes hypertension who presents to the emergency room for evaluation of nausea, vomiting and constipation for the past few weeks.  Over the past several days she developed abdominal pain that acutely worsened on the day of arrival.  She denied fever or chills and denied dysuria.  Denied blood in the stool and denied blood or bile when she vomited.  Patient has been on opioids for low back pain following a fall in August when she suffered a fracture of T12 and L1 which she believes is contributing to her recurrent vomiting and constipation.  She saw neurosurgery on 9/26 who switched her from [place] to tramadol.  She was also found to have an adrenal mass for which GI and surgical consults are pending. ED course and data review: BP 133/58 with otherwise normal vitals.  CMP and CBC significant for glucose of 480, hemoglobin 10.9.  Lipase normal at 43.  EKG, personally viewed and interpreted showing sinus rhythm at 82 with no acute ST-T wave changes.  CT abdomen and pelvis significant mostly for severe proctitis showing the following: IMPRESSION: 1. Marked severity proctitis. 2. Small hiatal hernia. 3. Stable bilateral adrenal masses which may represent adrenal adenomas. Correlation with adrenal MRI is recommended. This recommendation follows ACR consensus guidelines: Management of Incidental Adrenal Masses: A White Paper of the ACR Incidental Findings Committee. J Am Coll Radiol  2017;14:1038-1044. 4. Chronic fracture deformity of the L3 vertebral body. 5. Grade 1 anterolisthesis of the L4 vertebral body on L5. 6. Aortic atherosclerosis.   On rectal exam in the ED patient was found to have a large stool ball and was disimpacted.  Patient was given a fluid bolus, MiraLAX, started on ceftriaxone and Flagyl for proctitis.  Hospitalist consulted for admission.    Review of Systems: As mentioned in the history of present illness. All other systems reviewed and are negative.  Past Medical History:  Diagnosis Date   Diabetes mellitus without complication ([place])    Hypertension    Past Surgical History:  Procedure Laterality Date   IRRIGATION AND DEBRIDEMENT ABSCESS Right 07/05/2017   Procedure: IRRIGATION AND DEBRIDEMENT GLUTEAL ABSCESS;  Surgeon: Olean Ree, MD;  Location: [facility] ORS;  Service: General;  Laterality: Right;   Social History:  reports that she has quit smoking. She has never used smokeless tobacco. She reports that she does not currently use alcohol. She reports that she does not use drugs.  Allergies  Allergen Reactions   Bactrim [Sulfamethoxazole-Trimethoprim] Rash   Penicillins Anaphylaxis   Oxycodone Nausea And Vomiting   Propoxyphene Nausea And Vomiting    Family History  Problem Relation Age of Onset   Breast cancer Paternal Aunt     Prior to Admission medications   Medication Sig Start Date End Date Taking? Authorizing Provider  amLODipine (NORVASC) 5 MG tablet  07/11/17   [provider]  aspirin 81 MG chewable tablet Chew 81 mg by mouth daily.    [provider]  enalapril (  VASOTEC) 20 MG tablet  07/11/17   [provider]  furosemide (LASIX) 40 MG tablet Take by mouth. 07/13/17 07/13/18  [provider]  hydrochlorothiazide (HYDRODIURIL) 25 MG tablet Take 25 mg by mouth daily.    [provider]  insulin glargine (LANTUS) 100 UNIT/ML injection Inject 75 Units into the skin every morning.     [provider]  Insulin Pen Needle (PEN NEEDLES 31GX5/16") 31G X 8 MM MISC Use 1 Syringe 5 (five) times daily.    [provider]  lidocaine (LIDODERM) 5 % Place 1 patch onto the skin every 12 (twelve) hours. Remove & Discard patch within 12 hours or as directed by MD 12/19/21 12/19/22  Blake Divine, MD  metFORMIN (GLUCOPHAGE) 1000 MG tablet Take 1,000 mg by mouth 2 (two) times daily with a meal.    [provider]  metoCLOPramide (REGLAN) 10 MG tablet Take 1 tablet (10 mg total) by mouth 3 (three) times daily with meals. 01/05/22 01/05/23  Triplett, Johnette Abraham B, FNP  metoprolol succinate (TOPROL XL) 25 MG 24 hr tablet Take 25 mg by mouth daily.    [provider]  nystatin-triamcinolone ointment (MYCOLOG) Apply 1 application topically 2 (two) times daily. 07/12/17   Nestor Lewandowsky, MD  ondansetron (ZOFRAN-ODT) 4 MG disintegrating tablet Take 1 tablet (4 mg total) by mouth every 8 (eight) hours as needed for nausea or vomiting. 01/05/22   Triplett, Cari B, FNP  potassium chloride (K-DUR) 10 MEQ tablet  07/13/17   [provider]  simvastatin (ZOCOR) 20 MG tablet Take 20 mg by mouth daily.    [provider]  traMADol (ULTRAM) 50 MG tablet Take 1 tablet (50 mg total) by mouth every 12 (twelve) hours as needed. 01/10/22 01/10/23  Geronimo Boot, PA-C  TUBERCULIN SYR 1CC/25GX5/8" 25G X 5/8" 1 ML MISC Use as directed.    [provider]    Physical Exam: Vitals:   01/18/22 2140 01/18/22 2142  BP: (!) 133/58   Pulse: 90   Resp: 18   Temp: 98.3 F (36.8 C)   TempSrc: Oral   SpO2: 95%   Weight:  92.7 kg  Height:  5\' 5"  (1.651 m)   Physical Exam  Labs on Admission: I have personally reviewed following labs and imaging studies  CBC: Recent Labs  Lab 01/18/22 2146  WBC 10.1  NEUTROABS 8.3*  HGB 10.9*  HCT 36.4  MCV 79.8*  PLT 376   Basic Metabolic Panel: Recent Labs  Lab 01/18/22 2146  NA 137  K 4.0  CL 102  CO2 26  GLUCOSE 480*   BUN 22  CREATININE 0.97  CALCIUM 8.7*   GFR: Estimated Creatinine Clearance: 52.1 mL/min (by C-G formula based on SCr of 0.97 mg/dL). Liver Function Tests: Recent Labs  Lab 01/18/22 2146  AST 24  ALT 14  ALKPHOS 112  BILITOT 0.8  PROT 6.5  ALBUMIN 3.2*   Recent Labs  Lab 01/18/22 2146  LIPASE 43   No results for input(s): "AMMONIA" in the last 168 hours. Coagulation Profile: No results for input(s): "INR", "PROTIME" in the last 168 hours. Cardiac Enzymes: No results for input(s): "CKTOTAL", "CKMB", "CKMBINDEX", "TROPONINI" in the last 168 hours. BNP (last 3 results) No results for input(s): "PROBNP" in the last 8760 hours. HbA1C: No results for input(s): "HGBA1C" in the last 72 hours. CBG: No results for input(s): "GLUCAP" in the last 168 hours. Lipid Profile: No results for input(s): "CHOL", "HDL", "LDLCALC", "TRIG", "CHOLHDL", "LDLDIRECT" in the  last 72 hours. Thyroid Function Tests: No results for input(s): "TSH", "T4TOTAL", "FREET4", "T3FREE", "THYROIDAB" in the last 72 hours. Anemia Panel: No results for input(s): "VITAMINB12", "FOLATE", "FERRITIN", "TIBC", "IRON", "RETICCTPCT" in the last 72 hours. Urine analysis:    Component Value Date/Time   COLORURINE YELLOW (A) 01/05/2022 1930   APPEARANCEUR CLEAR (A) 01/05/2022 1930   LABSPEC <1.005 (L) 01/05/2022 1930   PHURINE 5.5 01/05/2022 1930   GLUCOSEU NEGATIVE 01/05/2022 1930   HGBUR NEGATIVE 01/05/2022 1930   BILIRUBINUR LARGE (A) 01/05/2022 1930   KETONESUR 40 (A) 01/05/2022 1930   PROTEINUR NEGATIVE 01/05/2022 1930   NITRITE NEGATIVE 01/05/2022 1930   LEUKOCYTESUR NEGATIVE 01/05/2022 1930    Radiological Exams on Admission: CT Abdomen Pelvis W Contrast  Result Date: 01/18/2022 CLINICAL DATA:  Abdominal pain with nausea, vomiting and constipation. EXAM: CT ABDOMEN AND PELVIS WITH CONTRAST TECHNIQUE: Multidetector CT imaging of the abdomen and pelvis was performed using the standard protocol following  bolus administration of intravenous contrast. RADIATION DOSE REDUCTION: This exam was performed according to the departmental dose-optimization program which includes automated exposure control, adjustment of the mA and/or kV according to patient size and/or use of iterative reconstruction technique. CONTRAST:  121mL OMNIPAQUE IOHEXOL 300 MG/ML  SOLN COMPARISON:  January 05, 2022 FINDINGS: Lower chest: No acute abnormality. Hepatobiliary: No focal liver abnormality is seen. No gallstones, gallbladder wall thickening, or biliary dilatation. Pancreas: Unremarkable. No pancreatic ductal dilatation or surrounding inflammatory changes. Spleen: Normal in size without focal abnormality. Adrenals/Urinary Tract: A stable 4.6 cm x 2.3 cm lobulated low-attenuation (approximately 50.89 Hounsfield units) right adrenal mass is seen. A stable 2.1 cm x 1.7 cm low-attenuation (approximately 36.57 Hounsfield units) left adrenal mass is also noted. Kidneys are normal, without renal calculi, focal lesion, or hydronephrosis. Bladder is unremarkable. Stomach/Bowel: There is a small hiatal hernia. Appendix appears normal. Stool is seen throughout the large bowel. No evidence of bowel dilatation. Marked severity concentric rectal wall thickening is seen. A moderate amount of surrounding inflammatory fat stranding is also noted. Vascular/Lymphatic: Aortic atherosclerosis. No enlarged abdominal or pelvic lymph nodes. Reproductive: Status post hysterectomy. No adnexal masses. Other: No abdominal wall hernia or abnormality. No abdominopelvic ascites. Musculoskeletal: A chronic fracture deformity is seen involving the superior endplate of the L3 vertebral body. Grade 1 anterolisthesis of the L4 vertebral body is noted on L5. Multilevel degenerative changes seen throughout the lumbar spine. IMPRESSION: 1. Marked severity proctitis. 2. Small hiatal hernia. 3. Stable bilateral adrenal masses which may represent adrenal adenomas. Correlation with  adrenal MRI is recommended. This recommendation follows ACR consensus guidelines: Management of Incidental Adrenal Masses: A White Paper of the ACR Incidental Findings Committee. J Am Coll Radiol 2017;14:1038-1044. 4. Chronic fracture deformity of the L3 vertebral body. 5. Grade 1 anterolisthesis of the L4 vertebral body on L5. 6. Aortic atherosclerosis. Aortic Atherosclerosis (ICD10-I70.0). Electronically Signed   By: Virgina Norfolk M.D.   On: 01/18/2022 22:34     Data Reviewed: Relevant notes from primary care and specialist visits, past discharge summaries as available in EHR, including Care Everywhere. Prior diagnostic testing as pertinent to current admission diagnoses Updated medications and problem lists for reconciliation ED course, including vitals, labs, imaging, treatment and response to treatment Triage notes, nursing and pharmacy notes and ED provider's notes Notable results as noted in HPI   Assessment and Plan: * Acute proctitis Constipation, suspect opiate-induced Patient was disimpacted in the ED Continue Rocephin and Flagyl Daily MiraLAX and Colace with Dulcolax as needed Minimize  opiates if at all possible  Uncontrolled type 2 diabetes mellitus with hyperglycemia, with long-term current use of insulin (HCC) Blood sugar 480 Continue basal insulin with sliding scale insulin coverage  Essential hypertension BP controlled. Continue metoprolol and enalapril pending med rec  Adrenal mass, bilateral (HCC) A stable 4.6 cm x 2.3 cm lobulated right adrenal mass is seen. 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  Consider [facility] Physical therapy evaluation     DVT  prophylaxis: Lovenox  Consults: none  Advance Care Planning:   Code Status: Prior   Family Communication: none  Disposition Plan: Back to previous home environment  Severity of Illness: The appropriate patient status for this patient is INPATIENT. Inpatient status is judged to be reasonable and necessary in order to provide the required intensity of service to ensure the patient's safety. The patient's presenting symptoms, physical exam findings, and initial radiographic and laboratory data in the context of their chronic comorbidities is felt to place them at high risk for further clinical deterioration. Furthermore, it is not anticipated that the patient will be medically stable for discharge from the hospital within 2 midnights of admission.   * I certify that at the point of admission it is my clinical judgment that the patient will require inpatient hospital care spanning beyond 2 midnights from the point of admission due to high intensity of service, high risk for further deterioration and high frequency of surveillance required.*  Author: Athena Masse, MD 01/18/2022 11:44 PM  For on call review [email or web address].

## 2022-01-18 NOTE — Assessment & Plan Note (Signed)
Frequent falls Patient seen by neurosurgery on 9/26 Pain control with Tylenol as needed NSAID with gastric protection, judicious opioid to prevent worsening constipation.  Consider [facility] Physical therapy evaluation

## 2022-01-18 NOTE — ED Provider Notes (Addendum)
[facility] Provider Note    Event Date/Time   First MD Initiated Contact with Patient 01/18/22 2146     (approximate)   History   Constipation (Pt bib ems for abd pain, n/v, and constipation. States she hasn't had bowel movement in 4 days. ) and Abdominal Pain   HPI  Rebecca Ball is a 81 y.o. female   Past medical history of diabetes, hypertension, hernia status postrepair, who presents with nausea, vomiting, constipation for the last several weeks.  Abdominal pain that acutely worsened today.  No blood in the emesis.  Last bowel movement was approximately 4 to 5 days ago, no blood.  She has had no fever or chills.  No dysuria or frequency.  Of note, she was seen in the emergency department 01/05/2022 after a fall and had a CT scan of the T and L-spine along with abdomen pelvis which showed an adrenal mass on the right side and had follow-up arranged with surgery and GI for further evaluation.  History was obtained via patient, her husband at bedside, review of external medical notes including emergency department note dated 01/05/2022.      Physical Exam   Triage Vital Signs: ED Triage Vitals  Enc Vitals Group     BP 01/18/22 2140 (!) 133/58     Pulse Rate 01/18/22 2140 90     Resp 01/18/22 2140 18     Temp 01/18/22 2140 98.3 F (36.8 C)     Temp Source 01/18/22 2140 Oral     SpO2 01/18/22 2140 95 %     Weight 01/18/22 2142 204 lb 5.9 oz (92.7 kg)     Height 01/18/22 2142 5\' 5"  (1.651 m)     Head Circumference --      Peak Flow --      Pain Score 01/18/22 2141 7     Pain Loc --      Pain Edu? --      Excl. in [place]? --     Most recent vital signs: Vitals:   01/18/22 2140  BP: (!) 133/58  Pulse: 90  Resp: 18  Temp: 98.3 F (36.8 C)  SpO2: 95%    General: Awake, no distress.  CV:  Good peripheral perfusion.  Resp:  Normal effort.  Abd:  No distention.  Diffuse tenderness, without rigidity or guarding Other:  Pleasant alert and  nontoxic-appearing with normal hemodynamics and no fever.   ED Results / Procedures / Treatments   Labs (all labs ordered are listed, but only abnormal results are displayed) Labs Reviewed  CBC WITH DIFFERENTIAL/PLATELET - Abnormal; Notable for the following components:      Result Value   Hemoglobin 10.9 (*)    MCV 79.8 (*)    MCH 23.9 (*)    MCHC 29.9 (*)    Neutro Abs 8.3 (*)    All other components within normal limits  COMPREHENSIVE METABOLIC PANEL - Abnormal; Notable for the following components:   Glucose, Bld 480 (*)    Calcium 8.7 (*)    Albumin 3.2 (*)    GFR, Estimated 59 (*)    All other components within normal limits  LIPASE, BLOOD  URINALYSIS, ROUTINE W REFLEX MICROSCOPIC     I reviewed labs and they are notable for globin 10.9, stable from prior - no leukocytosis.  EKG  ED ECG REPORT I, Lucillie Garfinkel, the attending physician, personally viewed and interpreted this ECG.   Date: 01/18/2022  EKG Time: 2227  Rate: 82  Rhythm: normal sinus rhythm  Axis: normal  Intervals:no ischmic changes, poor r wave progression    RADIOLOGY I independently reviewed and interpreted CT abd and see large stool ball in recturm   PROCEDURES:  Critical Care performed: No  Fecal disimpaction  Date/Time: 01/18/2022 11:11 PM  Performed by: Lucillie Garfinkel, MD Authorized by: Lucillie Garfinkel, MD  Consent: Verbal consent obtained. Risks and benefits: risks, benefits and alternatives were discussed Consent given by: patient Patient understanding: patient states understanding of the procedure being performed Patient consent: the patient's understanding of the procedure matches consent given Procedure consent: procedure consent matches procedure scheduled Relevant documents: relevant documents present and verified Test results: test results available and properly labeled Imaging studies: imaging studies available Patient identity confirmed: verbally with patient Local anesthesia  used: no  Anesthesia: Local anesthesia used: no  Sedation: Patient sedated: no  Patient tolerance: patient tolerated the procedure well with no immediate complications Comments: Large hard stool disimpacted      MEDICATIONS ORDERED IN ED: Medications  cefTRIAXone (ROCEPHIN) 1 g in sodium chloride 0.9 % 100 mL IVPB (has no administration in time range)  metroNIDAZOLE (FLAGYL) IVPB 500 mg (has no administration in time range)  ondansetron (ZOFRAN) injection 4 mg (4 mg Intravenous Given 01/18/22 2202)  sodium chloride 0.9 % bolus 1,000 mL (1,000 mLs Intravenous New Bag/Given 01/18/22 2201)  iohexol (OMNIPAQUE) 300 MG/ML solution 100 mL (100 mLs Intravenous Contrast Given 01/18/22 2211)    Consultants:  I spoke with hospitalist re admission and  regarding care plan for this patient.   IMPRESSION / MDM / ASSESSMENT AND PLAN / ED COURSE  I reviewed the triage vital signs and the nursing notes.                              Differential diagnosis includes, but is not limited to, intra-abdominal infection or obstruction, constipation, UTI, malignancy   The patient is on the cardiac monitor to evaluate for evidence of arrhythmia and/or significant heart rate changes.  MDM: Patient with constipation and nausea vomiting with an tender abdomen concerning for intra-abdominal infection or obstruction.  CT scan of the abdomen and pelvis to further assess, concern for malignancy given adrenal mass on CT scan on prior emergency department visit.  CT scan with large stool burden and a stool ball in the rectum with proctitis.  I performed a fecal disimpaction which the patient tolerated well, see procedure note.  Ceftriaxone and Flagyl for intra-abdominal infection proctitis coverage.  Admit to the hospital.  Patient's presentation is most consistent with acute presentation with potential threat to life or bodily function.       FINAL CLINICAL IMPRESSION(S) / ED DIAGNOSES   Final  diagnoses:  Proctitis  Fecal impaction ([place])     Rx / DC Orders   ED Discharge Orders     None        Note:  This document was prepared using Dragon voice recognition software and may include unintentional dictation errors.    Lucillie Garfinkel, MD 01/18/22 2310    Lucillie Garfinkel, MD 01/18/22 [phone]

## 2022-01-18 NOTE — ED Triage Notes (Signed)
Pt bib ems for abd pain, n/v, and constipation. States she hasn't had bowel movement in 4 days.

## 2022-01-19 DIAGNOSIS — E1165 Type 2 diabetes mellitus with hyperglycemia: Secondary | ICD-10-CM

## 2022-01-19 DIAGNOSIS — K5903 Drug induced constipation: Secondary | ICD-10-CM | POA: Diagnosis not present

## 2022-01-19 DIAGNOSIS — Z794 Long term (current) use of insulin: Secondary | ICD-10-CM

## 2022-01-19 DIAGNOSIS — K6289 Other specified diseases of anus and rectum: Secondary | ICD-10-CM | POA: Diagnosis not present

## 2022-01-19 LAB — CBG MONITORING, ED
Glucose-Capillary: 265 mg/dL — ABNORMAL HIGH (ref 70–99)
Glucose-Capillary: 83 mg/dL (ref 70–99)
Glucose-Capillary: 148 mg/dL — ABNORMAL HIGH (ref 70–99)
Glucose-Capillary: 154 mg/dL — ABNORMAL HIGH (ref 70–99)

## 2022-01-19 LAB — HEMOGLOBIN A1C
Hgb A1c MFr Bld: 8.7 % — ABNORMAL HIGH (ref 4.8–5.6)
Mean Plasma Glucose: 202.99 mg/dL

## 2022-01-19 LAB — GLUCOSE, CAPILLARY: Glucose-Capillary: 141 mg/dL — ABNORMAL HIGH (ref 70–99)

## 2022-01-19 MED ORDER — LACTULOSE 10 GM/15ML PO SOLN
20.0000 g | Freq: Once | ORAL | Status: AC
  Administered 2022-01-19: 20 g via ORAL
  Filled 2022-01-19: qty 30

## 2022-01-19 MED ORDER — INSULIN ASPART 100 UNIT/ML IJ SOLN
0.0000 [IU] | Freq: Every day | INTRAMUSCULAR | Status: DC
  Administered 2022-01-19: 3 [IU] via SUBCUTANEOUS
  Filled 2022-01-19: qty 1

## 2022-01-19 MED ORDER — SODIUM CHLORIDE 0.9 % IV SOLN
2.0000 g | INTRAVENOUS | Status: DC
  Administered 2022-01-19: 2 g via INTRAVENOUS
  Filled 2022-01-19: qty 20
  Filled 2022-01-19: qty 2

## 2022-01-19 MED ORDER — INSULIN ASPART 100 UNIT/ML IJ SOLN
0.0000 [IU] | Freq: Three times a day (TID) | INTRAMUSCULAR | Status: DC
  Administered 2022-01-19: 3 [IU] via SUBCUTANEOUS
  Administered 2022-01-19 – 2022-01-20 (×2): 2 [IU] via SUBCUTANEOUS
  Filled 2022-01-19 (×3): qty 1

## 2022-01-19 MED ORDER — METOPROLOL SUCCINATE ER 25 MG PO TB24
25.0000 mg | ORAL_TABLET | Freq: Every day | ORAL | Status: DC
  Administered 2022-01-19 – 2022-01-20 (×2): 25 mg via ORAL
  Filled 2022-01-19 (×2): qty 1

## 2022-01-19 MED ORDER — ASPIRIN 81 MG PO CHEW
81.0000 mg | CHEWABLE_TABLET | Freq: Every day | ORAL | Status: DC
  Administered 2022-01-19 – 2022-01-20 (×2): 81 mg via ORAL
  Filled 2022-01-19 (×2): qty 1

## 2022-01-19 MED ORDER — TRAMADOL HCL 50 MG PO TABS: 50.0000 mg | ORAL_TABLET | Freq: Two times a day (BID) | ORAL | Status: DC | PRN

## 2022-01-19 MED ORDER — DOCUSATE SODIUM 100 MG PO CAPS
100.0000 mg | ORAL_CAPSULE | Freq: Every day | ORAL | Status: DC
  Administered 2022-01-19 – 2022-01-20 (×2): 100 mg via ORAL
  Filled 2022-01-19 (×2): qty 1

## 2022-01-19 MED ORDER — ENOXAPARIN SODIUM 60 MG/0.6ML IJ SOSY
0.5000 mg/kg | PREFILLED_SYRINGE | INTRAMUSCULAR | Status: DC
  Administered 2022-01-19 – 2022-01-20 (×2): 47.5 mg via SUBCUTANEOUS
  Filled 2022-01-19 (×2): qty 0.6

## 2022-01-19 MED ORDER — ACETAMINOPHEN 325 MG PO TABS: 650.0000 mg | ORAL_TABLET | Freq: Four times a day (QID) | ORAL | Status: DC | PRN

## 2022-01-19 MED ORDER — LACTULOSE 10 GM/15ML PO SOLN: 30.0000 g | Freq: Once | ORAL | Status: DC

## 2022-01-19 MED ORDER — INSULIN GLARGINE-YFGN 100 UNIT/ML ~~LOC~~ SOLN
20.0000 [IU] | Freq: Every day | SUBCUTANEOUS | Status: DC
  Administered 2022-01-19 – 2022-01-20 (×2): 20 [IU] via SUBCUTANEOUS
  Filled 2022-01-19 (×2): qty 0.2

## 2022-01-19 MED ORDER — LACTULOSE 10 GM/15ML PO SOLN: 20.0000 g | Freq: Once | ORAL | Status: DC

## 2022-01-19 MED ORDER — SODIUM CHLORIDE 0.9 % IV SOLN: INTRAVENOUS | Status: DC

## 2022-01-19 MED ORDER — METRONIDAZOLE 500 MG/100ML IV SOLN
500.0000 mg | Freq: Two times a day (BID) | INTRAVENOUS | Status: DC
  Administered 2022-01-19 – 2022-01-20 (×3): 500 mg via INTRAVENOUS
  Filled 2022-01-19 (×3): qty 100

## 2022-01-19 MED ORDER — ACETAMINOPHEN 650 MG RE SUPP: 650.0000 mg | Freq: Four times a day (QID) | RECTAL | Status: DC | PRN

## 2022-01-19 MED ORDER — ONDANSETRON HCL 4 MG/2ML IJ SOLN
4.0000 mg | Freq: Four times a day (QID) | INTRAMUSCULAR | Status: DC | PRN
  Administered 2022-01-19: 4 mg via INTRAVENOUS
  Filled 2022-01-19: qty 2

## 2022-01-19 MED ORDER — ONDANSETRON HCL 4 MG PO TABS: 4.0000 mg | ORAL_TABLET | Freq: Four times a day (QID) | ORAL | Status: DC | PRN

## 2022-01-19 NOTE — Progress Notes (Signed)
PHARMACIST - PHYSICIAN COMMUNICATION  CONCERNING:  Enoxaparin (Lovenox) for DVT Prophylaxis    RECOMMENDATION: Patient was prescribed enoxaprin 40mg  q24 hours for VTE prophylaxis.   Filed Weights   01/18/22 2142  Weight: 92.7 kg (204 lb 5.9 oz)    Body mass index is 34.01 kg/m.  Estimated Creatinine Clearance: 52.1 mL/min (by C-G formula based on SCr of 0.97 mg/dL).   Based on [place] patient is candidate for enoxaparin 0.5mg /kg TBW SQ every 24 hours based on BMI being >30.  DESCRIPTION: Pharmacy has adjusted enoxaparin dose per [facility] policy.  Patient is now receiving enoxaparin 0.5 mg/kg every 24 hours   Renda Rolls, PharmD, [facility] 01/19/2022 12:11 AM

## 2022-01-19 NOTE — Evaluation (Signed)
Physical Therapy Evaluation Patient Details Name: Rebecca Ball MRN: [id number] DOB: October 01, 1940 Today's Date: 01/19/2022  History of Present Illness  presented to ER secondary to abdominal pain, constipation; admitted for management of acute proctitis, constipation (likely opiate induced).  Of note, recent fall (12/19/21) with non comminuted and nondisplaced fracture of the T12  vertebra; nondisplaced, non comminuted fracture of the left anterior  superior aspect of the L1 vertebra (stable on follow-up imaging on 01/05/22)-was issued brace per patient report, but does not wear.  Clinical Impression  Patient resting in bed upon arrival to room; alert and oriented, follows commands and agreeable to participation with treatment session.  Endorses marked improvement in abdominal pain since admission (nearly completely resolved); does voice need for toileting.  Bilat UE/LE strength and ROM grossly symmetrical and WFL; no focal weakness appreciated.  Able to complete bed mobility with min assist; sit/stand, basic transfers and gait (120') with RW, cga/close sup.  Demonstrates reciprocal stepping pattern with fair step height/length; limited endurance and dynamic balance; slow, guarded cadence, but no overt buckling or LOB   Would benefit from skilled PT to address above deficits and promote optimal return to PLOF.; Recommend transition to HHPT upon discharge from acute hospitalization.      Recommendations for follow up therapy are one component of a multi-disciplinary discharge planning process, led by the attending physician.  Recommendations may be updated based on patient status, additional functional criteria and insurance authorization.  Follow Up Recommendations Home health PT      Assistance Recommended at Discharge PRN  Patient can return home with the following  A little help with walking and/or transfers;A little help with bathing/dressing/bathroom    Equipment Recommendations     Recommendations for Other Services       Functional Status Assessment Patient has had a recent decline in their functional status and demonstrates the ability to make significant improvements in function in a reasonable and predictable amount of time.     Precautions / Restrictions Precautions Precautions: Fall Restrictions Weight Bearing Restrictions: No      Mobility  Bed Mobility Overal bed mobility: Needs Assistance Bed Mobility: Supine to Sit     Supine to sit: Min assist          Transfers Overall transfer level: Needs assistance Equipment used: Rolling Ferrentino (2 wheels) Transfers: Sit to/from Stand, Bed to chair/wheelchair/BSC Sit to Stand: Min assist Stand pivot transfers: Min assist              Ambulation/Gait Ambulation/Gait assistance: Min guard Gait Distance (Feet): 120 Feet Assistive device: Rolling [place] (2 wheels)         General Gait Details: reciprocal stepping pattern with fair step height/length; slow, guarded cadence, but no overt buckling or LOB  Stairs            Wheelchair Mobility    Modified Rankin (Stroke Patients Only)       Balance Overall balance assessment: Needs assistance Sitting-balance support: No upper extremity supported, Feet supported Sitting balance-Leahy Scale: Good     Standing balance support: Bilateral upper extremity supported Standing balance-Leahy Scale: Fair                               Pertinent Vitals/Pain Pain Assessment Pain Assessment: Faces Faces Pain Scale: Hurts a little bit Pain Location: back, abdoment Pain Descriptors / Indicators: Aching Pain Intervention(s): Limited activity within patient's tolerance, Monitored during session, Repositioned  Home Living Family/patient expects to be discharged to:: Private residence Living Arrangements: Spouse/significant other Available Help at Discharge: Family;Available PRN/intermittently Type of Home: House Home  Access: Stairs to enter   [employer] of Steps: 4-5 total to get onto back deck   Home Layout: One level Home Equipment: Cane - single Librarian, academic (2 wheels)      Prior Function Prior Level of Function : Independent/Modified Independent             Mobility Comments: Mod indep with RW for household mobilization; does endorse multiple fall history       Hand Dominance        Extremity/Trunk Assessment   Upper Extremity Assessment Upper Extremity Assessment: Overall WFL for tasks assessed    Lower Extremity Assessment Lower Extremity Assessment: Overall WFL for tasks assessed (grossly at least 4/5 throughout; no focal weakness appreciated)       Communication   Communication: No difficulties  Cognition Arousal/Alertness: Awake/alert Behavior During Therapy: WFL for tasks assessed/performed Overall Cognitive Status: Within Functional Limits for tasks assessed                                          General Comments      Exercises Other Exercises Other Exercises: Toilet transfer, SPT with RW, min assist; sit/stand from [facility] with RW, min assist; standing balance for hygiene and peri-care with RW, min assist.  Successful BM   Assessment/Plan    PT Assessment Patient needs continued PT services  PT Problem List Decreased strength;Decreased activity tolerance;Decreased balance;Decreased mobility;Decreased knowledge of use of DME;Decreased safety awareness;Decreased knowledge of precautions;Pain       PT Treatment Interventions DME instruction;Gait training;Stair training;Functional mobility training;Therapeutic activities;Therapeutic exercise;Balance training;Patient/family education    PT Goals (Current goals can be found in the Care Plan section)  Acute Rehab PT Goals Patient Stated Goal: to get this pain better and to be able to go to the bathroom PT Goal Formulation: With patient Time For Goal Achievement:  02/02/22 Potential to Achieve Goals: Good    Frequency Min 2X/week     Co-evaluation               AM-PAC PT "6 Clicks" Mobility  Outcome Measure Help needed turning from your back to your side while in a flat bed without using bedrails?: None Help needed moving from lying on your back to sitting on the side of a flat bed without using bedrails?: A Little Help needed moving to and from a bed to a chair (including a wheelchair)?: A Little Help needed standing up from a chair using your arms (e.g., wheelchair or bedside chair)?: A Little Help needed to walk in hospital room?: A Little Help needed climbing 3-5 steps with a railing? : A Little 6 Click Score: 19    End of Session Equipment Utilized During Treatment: Gait belt Activity Tolerance: Patient tolerated treatment well Patient left: in bed;with call bell/phone within reach Nurse Communication: Mobility status PT Visit Diagnosis: Muscle weakness (generalized) (M62.81);Difficulty in walking, not elsewhere classified (R26.2)    Time: 1126- 1205     Charges:   PT Evaluation $PT Eval Moderate Complexity: 1 Mod PT Treatments $Therapeutic Activity: 8-22 mins        Dalissa Lovin H. Manson Passey, PT, DPT, NCS 01/19/22, 2:04 PM [phone]

## 2022-01-19 NOTE — Progress Notes (Signed)
  Progress Note   Patient: Rebecca Ball:[id number] DOB: 23-May-1940 DOA: 01/18/2022     1 DOS: the patient was seen and examined on 01/19/2022   Brief hospital course: Rebecca Ball is a 81 y.o. female with medical history significant for Insulin-dependent type 2 diabetes hypertension who presents to the emergency room for evaluation of constipation for the past few weeks and now abdominal pain that acutely worsened on the day of arrival. CT scan showed a severe proctitis.  Stable adrenal masses. Patient is a placed on IV antibiotics with ceftriaxone and Flagyl.  Patient also had a digital decompression for constipation.  Assessment and Plan: * Acute proctitis Constipation, suspect opiate-induced Patient appears doing better today, she had a digital decompression, will continue bowel protocol.  I will give her a dose of lactulose, last bowel movement was 5 days ago. Patient started eating, discontinue IV fluids.  Uncontrolled type 2 diabetes mellitus with hyperglycemia, with long-term current use of insulin ([employer]) Continue current coverage.  Essential hypertension Blood pressure.  Adrenal mass, bilateral (HCC) A stable 4.6 cm x 2.3 cm lobulated right adrenal mass is seen. 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  Obesity with BMI 34. Diet exercise advised.     Subjective:  Patient feels better today, abdominal distention is better.  Abdominal pain is also better.  No nausea vomiting.  But still has not had a bowel movement since last decompression removed a few very hard stools.  Physical Exam: Vitals:   01/19/22 0300 01/19/22 0500 01/19/22 0800 01/19/22 0956  BP: (!) 132/58 (!) 107/52 121/62   Pulse: 80 84 73   Resp: (!) 22 19 18    Temp: 98 F (36.7 C) 98.4 F (36.9 C)  98.1 F (36.7 C)   TempSrc:    Oral  SpO2: 93% (!) 89% 90%   Weight:      Height:       General exam: Appears calm and comfortable  Respiratory system: Clear to auscultation. Respiratory effort normal. Cardiovascular system: S1 & S2 heard, RRR. No JVD, murmurs, rubs, gallops or clicks. No pedal edema. Gastrointestinal system: Abdomen is nondistended, soft and nontender. No organomegaly or masses felt. Normal bowel sounds heard. Central nervous system: Alert and oriented. No focal neurological deficits. Extremities: Symmetric 5 x 5 power. Skin: No rashes, lesions or ulcers Psychiatry: Judgement and insight appear normal. Mood & affect appropriate.   Data Reviewed:  Lab results reviewed, CT scan results reviewed.  Family Communication: Husband updated at bedside.  Disposition: Status is: Inpatient Remains inpatient appropriate because: Severity of disease, IV treatment.  Planned Discharge Destination: Home with Home Health    Time spent: 35 minutes  Author: Sharen Hones, MD 01/19/2022 10:27 AM  For on call review [email or web address].

## 2022-01-19 NOTE — Hospital Course (Signed)
Rebecca Ball is a 81 y.o. female with medical history significant for Insulin-dependent type 2 diabetes hypertension who presents to the emergency room for evaluation of constipation for the past few weeks and now abdominal pain that acutely worsened on the day of arrival. CT scan showed a severe proctitis.  Stable adrenal masses. Patient is a placed on IV antibiotics with ceftriaxone and Flagyl.  Patient also had a digital decompression for constipation.

## 2022-01-20 ENCOUNTER — Other Ambulatory Visit: Payer: Self-pay

## 2022-01-20 ENCOUNTER — Encounter: Payer: Self-pay | Admitting: Internal Medicine

## 2022-01-20 DIAGNOSIS — K5903 Drug induced constipation: Secondary | ICD-10-CM | POA: Diagnosis not present

## 2022-01-20 DIAGNOSIS — E1165 Type 2 diabetes mellitus with hyperglycemia: Secondary | ICD-10-CM | POA: Diagnosis not present

## 2022-01-20 DIAGNOSIS — K6289 Other specified diseases of anus and rectum: Secondary | ICD-10-CM | POA: Diagnosis not present

## 2022-01-20 DIAGNOSIS — Z794 Long term (current) use of insulin: Secondary | ICD-10-CM | POA: Diagnosis not present

## 2022-01-20 LAB — GLUCOSE, CAPILLARY: Glucose-Capillary: 123 mg/dL — ABNORMAL HIGH (ref 70–99)

## 2022-01-20 MED ORDER — METRONIDAZOLE 500 MG PO TABS: 500.0000 mg | ORAL_TABLET | Freq: Three times a day (TID) | ORAL | 0 refills | Status: AC

## 2022-01-20 MED ORDER — LACTULOSE 20 GM/30ML PO SOLN: 10.0000 mL | Freq: Two times a day (BID) | ORAL | 0 refills | Status: AC | PRN

## 2022-01-20 MED ORDER — INSULIN GLARGINE 100 UNIT/ML ~~LOC~~ SOLN: 20.0000 [IU] | Freq: Every morning | SUBCUTANEOUS | 0 refills | Status: DC

## 2022-01-20 MED ORDER — CEPHALEXIN 500 MG PO CAPS: 500.0000 mg | ORAL_CAPSULE | Freq: Three times a day (TID) | ORAL | 0 refills | Status: AC

## 2022-01-20 NOTE — Discharge Summary (Signed)
Physician Discharge Summary   Patient: Rebecca Ball MRN: [id number] DOB: [birth year]  Admit date:     01/18/2022  Discharge date: 01/20/22  Discharge Physician: Sharen Hones   PCP: Kirk Ruths, MD   Recommendations at discharge:   Follow-up with PCP in 1 week. Follow-up with gastroenterology as scheduled in end of October.  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 *  Hospital Course: Rebecca Ball is a 81 y.o. female with medical history significant for Insulin-dependent type 2 diabetes hypertension who presents to the emergency room for evaluation of constipation for the past few weeks and now abdominal pain that acutely worsened on the day of arrival. CT scan showed a severe proctitis.  Stable adrenal masses. Patient is a placed on IV antibiotics with ceftriaxone and Flagyl.  Patient also had a digital decompression for constipation.  Assessment and Plan: * Acute proctitis Constipation, suspect opiate-induced Patient received IV antibiotics with ceftriaxone and Flagyl, also had a digital disimpaction.  He also received lactulose, she had multiple bowel movements.  Condition had improved, abdominal pain and nausea vomiting has resolved.  At this point, she is medically stable to be discharged, continue oral antibiotics with Keflex and Flagyl for additional 6 days. Patient be followed with PCP in 1 week. Patient also has appointment in end of October with her gastroenterologist.   Uncontrolled type 2 diabetes mellitus with hyperglycemia, with long-term current use of insulin ([place]) Glucose was  lower in the hospital, long-acting insulin was reduced in dose, may increase dose when glucose running high with PCP.   Essential hypertension Blood pressure not significant elevated, blood pressure medication adjusted.   Adrenal mass, bilateral (HCC) A stable 4.6 cm x 2.3 cm lobulated right adrenal mass is seen. A stable 2.1 cm x 1.7 cm low-attenuation left adrenal mass is also noted Patient referred to Dr. Lysle Pearl as outpatient.    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  Patient has scheduled appointment with surgery in [facility].   Obesity with BMI 34. Diet exercise advised.       Consultants: None Procedures performed: None  Disposition: Home Diet recommendation:  Discharge Diet Orders (From admission, onward)     Start     Ordered   01/20/22 0000  Diet - low sodium heart healthy        01/20/22 0932           Cardiac diet DISCHARGE MEDICATION: Allergies as of 01/20/2022       Reactions   Bactrim [sulfamethoxazole-trimethoprim] Rash   Penicillins Anaphylaxis   Oxycodone Nausea And Vomiting   Propoxyphene Nausea And Vomiting        Medication List     STOP taking these medications    enalapril 20 MG tablet Commonly known as: VASOTEC   furosemide 40 MG tablet Commonly known as: LASIX   hydrochlorothiazide 25 MG tablet Commonly known as: HYDRODIURIL   lidocaine 5 % Commonly known as: Lidoderm   nystatin-triamcinolone ointment Commonly known as: MYCOLOG   ondansetron 4 MG disintegrating tablet Commonly known as: ZOFRAN-ODT   traMADol  50 MG tablet Commonly known as: Ultram       TAKE these medications    amLODipine 5 MG tablet Commonly known as: NORVASC   aspirin 81 MG chewable tablet Chew 81 mg by mouth daily.   cephALEXin 500 MG capsule Commonly known as: KEFLEX Take 1 capsule (500 mg total) by mouth 3 (three) times daily for 6 days.   insulin glargine 100 UNIT/ML  injection Commonly known as: LANTUS Inject 0.2 mLs (20 Units total) into the skin every morning. What changed: how much to take   Lactulose 20 GM/30ML Soln Take 10 mLs (6.6667 g total) by mouth 2 (two) times daily as needed (constipation).   metFORMIN 1000 MG tablet Commonly known as: GLUCOPHAGE Take 500 mg by mouth 2 (two) times daily with a meal.   metoCLOPramide 10 MG tablet Commonly known as: REGLAN Take 1 tablet (10 mg total) by mouth 3 (three) times daily with meals.   metroNIDAZOLE 500 MG tablet Commonly known as: Flagyl Take 1 tablet (500 mg total) by mouth 3 (three) times daily for 6 days.   PEN NEEDLES 31GX5/16" 31G X 8 MM Misc Use 1 Syringe 5 (five) times daily.   simvastatin 20 MG tablet Commonly known as: ZOCOR Take 20 mg by mouth daily.   Toprol XL 25 MG 24 hr tablet Generic drug: metoprolol succinate Take 25 mg by mouth daily.   TUBERCULIN SYR 1CC/25GX5/8" 25G X 5/8" 1 ML Misc Use as directed.        Discharge Exam: Filed Weights   01/18/22 2142  Weight: 92.7 kg   General exam: Appears calm and comfortable  Respiratory system: Clear to auscultation. Respiratory effort normal. Cardiovascular system: S1 & S2 heard, RRR. No JVD, murmurs, rubs, gallops or clicks. No pedal edema. Gastrointestinal system: Abdomen is nondistended, soft and nontender. No organomegaly or masses felt. Normal bowel sounds heard. Central nervous system: Alert and oriented. No focal neurological deficits. Extremities: Symmetric 5 x 5 power. Skin: No rashes, lesions or ulcers Psychiatry: Judgement and insight appear normal. Mood & affect appropriate.    Condition at discharge: good  The results of significant diagnostics from this hospitalization (including imaging, microbiology, ancillary and laboratory) are listed below for reference.   Imaging Studies: CT Abdomen Pelvis W Contrast  Result Date: 01/18/2022 CLINICAL DATA:  Abdominal pain with nausea, vomiting and  constipation. EXAM: CT ABDOMEN AND PELVIS WITH CONTRAST TECHNIQUE: Multidetector CT imaging of the abdomen and pelvis was performed using the standard protocol following bolus administration of intravenous contrast. RADIATION DOSE REDUCTION: This exam was performed according to the departmental dose-optimization program which includes automated exposure control, adjustment of the mA and/or kV according to patient size and/or use of iterative reconstruction technique. CONTRAST:  173mL OMNIPAQUE IOHEXOL 300 MG/ML  SOLN COMPARISON:  January 05, 2022 FINDINGS: Lower chest: No acute abnormality. Hepatobiliary: No focal liver abnormality is seen. No gallstones, gallbladder wall thickening, or biliary dilatation. Pancreas: Unremarkable. No pancreatic ductal dilatation or surrounding inflammatory changes. Spleen: Normal in size without focal abnormality. Adrenals/Urinary Tract: A stable 4.6 cm x 2.3 cm lobulated low-attenuation (approximately 50.89 Hounsfield units) right adrenal mass is seen. A stable 2.1 cm x 1.7 cm low-attenuation (approximately 36.57 Hounsfield units) left adrenal mass is also noted. Kidneys are normal, without renal calculi, focal lesion, or hydronephrosis. Bladder is unremarkable. Stomach/Bowel: There is a small hiatal hernia. Appendix appears normal. Stool is seen throughout the large bowel. No evidence of bowel dilatation. Marked severity concentric rectal wall thickening is seen.  A moderate amount of surrounding inflammatory fat stranding is also noted. Vascular/Lymphatic: Aortic atherosclerosis. No enlarged abdominal or pelvic lymph nodes. Reproductive: Status post hysterectomy. No adnexal masses. Other: No abdominal wall hernia or abnormality. No abdominopelvic ascites. Musculoskeletal: A chronic fracture deformity is seen involving the superior endplate of the L3 vertebral body. Grade 1 anterolisthesis of the L4 vertebral body is noted on L5. Multilevel degenerative changes seen throughout the  lumbar spine. IMPRESSION: 1. Marked severity proctitis. 2. Small hiatal hernia. 3. Stable bilateral adrenal masses which may represent adrenal adenomas. Correlation with adrenal MRI is recommended. This recommendation follows ACR consensus guidelines: Management of Incidental Adrenal Masses: A White Paper of the ACR Incidental Findings Committee. J Am Coll Radiol 2017;14:1038-1044. 4. Chronic fracture deformity of the L3 vertebral body. 5. Grade 1 anterolisthesis of the L4 vertebral body on L5. 6. Aortic atherosclerosis. Aortic Atherosclerosis (ICD10-I70.0). Electronically Signed   By: Virgina Norfolk M.D.   On: 01/18/2022 22:34   CT ABDOMEN PELVIS W CONTRAST  Result Date: 01/05/2022 CLINICAL DATA:  Epigastric pain, nausea, fell 1 month ago, second fall last night, low blood sugar EXAM: CT ABDOMEN AND PELVIS WITH CONTRAST TECHNIQUE: Multidetector CT imaging of the abdomen and pelvis was performed using the standard protocol following bolus administration of intravenous contrast. RADIATION DOSE REDUCTION: This exam was performed according to the departmental dose-optimization program which includes automated exposure control, adjustment of the mA and/or kV according to patient size and/or use of iterative reconstruction technique. CONTRAST:  163mL OMNIPAQUE IOHEXOL 300 MG/ML SOLN IV. No oral contrast. COMPARISON:  12/19/2021 FINDINGS: Lower chest: Lung bases clear Hepatobiliary: Gallbladder and liver normal appearance Pancreas: Atrophic pancreas without mass Spleen: Normal appearance Adrenals/Urinary Tract: LEFT adrenal nodule 19 x 13 mm, 32 HU; this is a probable adrenal adenoma and follow-up adrenal washout CT imaging is recommended in 1 year. RIGHT adrenal mass 4.6 x 2.3 cm image 23, 23 HU anteriorly and 48 HU posteriorly; surgical consultation is recommended. Consider biochemical lab evaluation for functional status and pheochromocytoma prior to resection. Kidneys and ureters normal appearance. Bladder  decompressed, question cystocele. Stomach/Bowel: Normal appendix. Fluid in proximal colon. Small hiatal hernia. Stomach and remaining bowel loops normal appearance. Vascular/Lymphatic: Atherosclerotic calcifications aorta, iliac arteries, visceral arteries. Aorta normal caliber. No adenopathy. Reproductive: Uterus surgically absent. Question atrophic LEFT ovary with nonvisualization of RIGHT ovary. Other: Ventral surgical scar. Small fluid collection at the anterior abdominal wall infraumbilical unchanged, 2.9 x 1.5 cm, favor postoperative collection such as old hematoma, seroma or lymphocele. No free air or free fluid. No definite hernia. Mild retroperitoneal lipomatosis. Musculoskeletal: Osseous demineralization. Multilevel degenerative disc and facet disease changes lumbar spine with chronic superior endplate compression deformities of L1 and L3. IMPRESSION: Small hiatal hernia. LEFT adrenal nodule 19 x 13 mm; this is a probable adrenal adenoma and follow-up adrenal washout CT imaging is recommended in 1 year. RIGHT adrenal mass 4.6 x 2.3 cm; cannot exclude malignancy, surgical consultation is recommended, consider biochemical lab evaluation for functional status and pheochromocytoma prior to resection. Small fluid collection at the anterior abdominal wall infraumbilical, unchanged, favor postoperative collection such as old hematoma, seroma or lymphocele. Question cystocele. No acute intra-abdominal or intrapelvic abnormalities. Aortic Atherosclerosis (ICD10-I70.0). Electronically Signed   By: Lavonia Dana M.D.   On: 01/05/2022 18:33   CT L-SPINE NO CHARGE  Result Date: 01/05/2022 CLINICAL DATA:  Recent falls. EXAM: CT Lumbar Spine without contrast TECHNIQUE: Technique: Multiplanar CT images of the lumbar spine were reconstructed from contemporary CT of  the Abdomen and Pelvis. RADIATION DOSE REDUCTION: This exam was performed according to the departmental dose-optimization program which includes automated  exposure control, adjustment of the mA and/or kV according to patient size and/or use of iterative reconstruction technique. CONTRAST:  None COMPARISON:  12/19/2021 FINDINGS: Segmentation: 5 lumbar type vertebrae as previously established Alignment: Anterolisthesis at L4-5 and L5-S1.  Mild scoliosis. Vertebrae: Subacute fractures through the T12 inferior endplate and anterior inferior osteophyte and through the anterior and superior osteophyte of L1 and the adjacent endplate. No interval height loss. Gas is seen within the fracture cleft of L1. Transverse process fractures on the left at L1-L4. Remote L3 superior endplate fracture. Bridging osteophytes from T8-T12. Paraspinal and other soft tissues: Subcutaneous swelling the level of L1. Disc levels: T12- L1: Despite the adjacent fractures there is vacuum phenomenon within the collapsed disc space. Moderate right foraminal narrowing. L1-L2: Disc collapse with gas containing cleft. Endplate spurring and retrolisthesis causing right more than left foraminal impingement. L2-L3: Disc narrowing with endplate and facet spurring bilaterally. Left more than right foraminal impingement L3-L4: Disc narrowing and bilateral facet spurring. L4-L5: Advanced facet osteoarthritis with spurring and anterolisthesis. Disc space narrowing and at least moderate bilateral foraminal stenosis. Advanced spinal stenosis L5-S1:Facet osteoarthritis with spurring and mild anterolisthesis. IMPRESSION: 1. Unchanged alignment of subacute T12 and L1 vertebral body and osteophyte fractures. Subacute transverse process fractures of L1 through L4 on the left. 2. 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 Spinal stenosis is advanced at L4-5. 3. Bridging osteophytes from T8-T12 at least. Electronically Signed   By: Jorje Guild M.D.   On: 01/05/2022 18:31   US Abdomen Limited RUQ (LIVER/GB)  Result Date: 01/05/2022 CLINICAL DATA:  Right upper quadrant pain x2  weeks EXAM: ULTRASOUND ABDOMEN LIMITED RIGHT UPPER QUADRANT COMPARISON:  CT 12/19/2021 FINDINGS: Gallbladder: No gallstones or wall thickening visualized. No sonographic Murphy sign noted by sonographer. Common bile duct: Diameter: 3 mm. No intrahepatic biliary ductal dilatation identified. Liver: No focal lesion identified. Within normal limits in parenchymal echogenicity. Portal vein is patent on color Doppler imaging with normal direction of blood flow towards the liver. Other: None. IMPRESSION: Negative Electronically Signed   By: Lucrezia Europe M.D.   On: 01/05/2022 16:57   DG Humerus Right  Result Date: 01/05/2022 CLINICAL DATA:  Pain after fall history. [place] a month ago and also last night. Right upper arm pain. EXAM: RIGHT HUMERUS - 2+ VIEW COMPARISON:  None Available. FINDINGS: There is mildly decreased bone mineralization. Chronic cystic changes within the lateral humeral head, possibly from subacromial impingement. Moderate joint space narrowing and mild peripheral osteophytosis of the contacting the joint. Moderate medial elbow joint space narrowing and peripheral osteophytosis. No acute fracture is seen. No dislocation. IMPRESSION: 1. No acute fracture within the humerus. 2. Moderate acromioclavicular and medial elbow osteoarthritis. Electronically Signed   By: Yvonne Kendall M.D.   On: 01/05/2022 16:54   CT Head Wo Contrast  Result Date: 01/05/2022 CLINICAL DATA:  Nausea, fell 1 month ago and again last evening, hypoglycemia EXAM: CT HEAD WITHOUT CONTRAST TECHNIQUE: Contiguous axial images were obtained from the base of the skull through the vertex without intravenous contrast. RADIATION DOSE REDUCTION: This exam was performed according to the departmental dose-optimization program which includes automated exposure control, adjustment of the mA and/or kV according to patient size and/or use of iterative reconstruction technique. COMPARISON:  None Available. FINDINGS: Brain: Scattered hypodensities  throughout the periventricular white matter, bilateral basal ganglia, and bilateral  thalami most compatible with chronic small vessel ischemic change. No other signs of acute infarct or hemorrhage. The lateral ventricles and midline structures appear unremarkable. No acute extra-axial fluid collections. No mass effect. Vascular: No hyperdense vessel or unexpected calcification. Skull: Normal. Negative for fracture or focal lesion. Sinuses/Orbits: No acute finding. Other: None. IMPRESSION: 1. Chronic small vessel ischemic changes as above. No acute intracranial process. Electronically Signed   By: Sharlet Salina M.D.   On: 01/05/2022 16:02    Microbiology: Results for orders placed or performed during the hospital encounter of 01/05/22  Resp Panel by RT-PCR (Flu A&B, Covid) Anterior Nasal Swab     Status: None   Collection Time: 01/05/22 11:24 AM   Specimen: Anterior Nasal Swab  Result Value Ref Range Status   SARS Coronavirus 2 by RT PCR NEGATIVE NEGATIVE Final    Comment: (NOTE) SARS-CoV-2 target nucleic acids are NOT DETECTED.  The SARS-CoV-2 RNA is generally detectable in upper respiratory specimens during the acute phase of infection. The lowest concentration of SARS-CoV-2 viral copies this assay can detect is 138 copies/mL. A negative result does not preclude SARS-Cov-2 infection and should not be used as the sole basis for treatment or other patient management decisions. A negative result may occur with  improper specimen collection/handling, submission of specimen other than nasopharyngeal swab, presence of viral mutation(s) within the areas targeted by this assay, and inadequate number of viral copies(<138 copies/mL). A negative result must be combined with clinical observations, patient history, and epidemiological information. The expected result is Negative.  Fact Sheet for Patients:  BloggerCourse.com  Fact Sheet for Healthcare Providers:   SeriousBroker.it  This test is no t yet approved or cleared by the [place] FDA and  has been authorized for detection and/or diagnosis of SARS-CoV-2 by FDA under an Emergency Use Authorization (EUA). This EUA will remain  in effect (meaning this test can be used) for the duration of the COVID-19 declaration under Section 564(b)(1) of the Act, 21 U.S.C.section 360bbb-3(b)(1), unless the authorization is terminated  or revoked sooner.       Influenza A by PCR NEGATIVE NEGATIVE Final   Influenza B by PCR NEGATIVE NEGATIVE Final    Comment: (NOTE) The Xpert Xpress SARS-CoV-2/FLU/RSV plus assay is intended as an aid in the diagnosis of influenza from Nasopharyngeal swab specimens and should not be used as a sole basis for treatment. Nasal washings and aspirates are unacceptable for Xpert Xpress SARS-CoV-2/FLU/RSV testing.  Fact Sheet for Patients: BloggerCourse.com  Fact Sheet for Healthcare Providers: SeriousBroker.it  This test is not yet approved or cleared by the [place] FDA and has been authorized for detection and/or diagnosis of SARS-CoV-2 by FDA under an Emergency Use Authorization (EUA). This EUA will remain in effect (meaning this test can be used) for the duration of the COVID-19 declaration under Section 564(b)(1) of the Act, 21 U.S.C. section 360bbb-3(b)(1), unless the authorization is terminated or revoked.  Performed at [facility], [address] [place], [place] [address]     Labs: CBC: Recent Labs  Lab 01/18/22 2146  WBC 10.1  NEUTROABS 8.3*  HGB 10.9*  HCT 36.4  MCV 79.8*  PLT 320   Basic Metabolic Panel: Recent Labs  Lab 01/18/22 2146  NA 137  K 4.0  CL 102  CO2 26  GLUCOSE 480*  BUN 22  CREATININE 0.97  CALCIUM 8.7*   Liver Function Tests: Recent Labs  Lab 01/18/22 2146  AST 24  ALT 14  ALKPHOS  112  BILITOT 0.8  PROT 6.5  ALBUMIN  3.2*   CBG: Recent Labs  Lab 01/19/22 0804 01/19/22 1144 01/19/22 1650 01/19/22 2058 01/20/22 0807  GLUCAP 83 148* 154* 141* 123*    Discharge time spent: greater than 30 minutes.  Signed: Sharen Hones, MD [facility] 01/20/2022

## 2022-01-20 NOTE — Care Management Important Message (Signed)
Important Message  Patient Details  Name: Rebecca Ball MRN: [id number] Date of Birth: September 21, 1940   Medicare Important Message Given:  N/A - LOS <3 / Initial given by admissions     Dannette Barbara 01/20/2022, 9:03 AM

## 2022-01-27 ENCOUNTER — Ambulatory Visit: Admission: RE | Admit: 2022-01-27 | Payer: Medicare PPO | Source: Ambulatory Visit

## 2022-01-30 ENCOUNTER — Ambulatory Visit: Payer: Medicare PPO | Admitting: Surgery

## 2022-02-15 ENCOUNTER — Ambulatory Visit: Payer: Medicare PPO | Admitting: Surgery

## 2023-08-13 ENCOUNTER — Emergency Department

## 2023-08-13 ENCOUNTER — Observation Stay
Admission: EM | Admit: 2023-08-13 | Discharge: 2023-08-14 | Disposition: A | Discharge status: Home / Self Care | Attending: Student in an Organized Health Care Education/Training Program | Admitting: Student in an Organized Health Care Education/Training Program

## 2023-08-13 ENCOUNTER — Other Ambulatory Visit: Payer: Self-pay

## 2023-08-13 ENCOUNTER — Observation Stay

## 2023-08-13 ENCOUNTER — Encounter: Payer: Self-pay | Admitting: Student in an Organized Health Care Education/Training Program

## 2023-08-13 DIAGNOSIS — G459 Transient cerebral ischemic attack, unspecified: Secondary | ICD-10-CM | POA: Diagnosis not present

## 2023-08-13 DIAGNOSIS — I10 Essential (primary) hypertension: Secondary | ICD-10-CM | POA: Insufficient documentation

## 2023-08-13 DIAGNOSIS — R4701 Aphasia: Principal | ICD-10-CM | POA: Insufficient documentation

## 2023-08-13 DIAGNOSIS — Z87891 Personal history of nicotine dependence: Secondary | ICD-10-CM | POA: Diagnosis not present

## 2023-08-13 DIAGNOSIS — Z7982 Long term (current) use of aspirin: Secondary | ICD-10-CM | POA: Diagnosis not present

## 2023-08-13 DIAGNOSIS — Z794 Long term (current) use of insulin: Secondary | ICD-10-CM | POA: Diagnosis not present

## 2023-08-13 DIAGNOSIS — Z79899 Other long term (current) drug therapy: Secondary | ICD-10-CM | POA: Insufficient documentation

## 2023-08-13 DIAGNOSIS — E11649 Type 2 diabetes mellitus with hypoglycemia without coma: Secondary | ICD-10-CM | POA: Insufficient documentation

## 2023-08-13 DIAGNOSIS — Z7984 Long term (current) use of oral hypoglycemic drugs: Secondary | ICD-10-CM | POA: Diagnosis not present

## 2023-08-13 DIAGNOSIS — F0390 Unspecified dementia without behavioral disturbance: Secondary | ICD-10-CM | POA: Diagnosis not present

## 2023-08-13 DIAGNOSIS — E162 Hypoglycemia, unspecified: Secondary | ICD-10-CM

## 2023-08-13 DIAGNOSIS — R4182 Altered mental status, unspecified: Secondary | ICD-10-CM | POA: Diagnosis present

## 2023-08-13 DIAGNOSIS — R2689 Other abnormalities of gait and mobility: Secondary | ICD-10-CM | POA: Diagnosis not present

## 2023-08-13 LAB — CBC WITH DIFFERENTIAL/PLATELET
Abs Immature Granulocytes: 0.02 10*3/uL (ref 0.00–0.07)
Basophils Absolute: 0 10*3/uL (ref 0.0–0.1)
Basophils Relative: 1 %
Eosinophils Absolute: 0 10*3/uL (ref 0.0–0.5)
Eosinophils Relative: 1 %
HCT: 42.5 % (ref 36.0–46.0)
Hemoglobin: 12.7 g/dL (ref 12.0–15.0)
Immature Granulocytes: 0 %
Lymphocytes Relative: 14 %
Lymphs Abs: 1 10*3/uL (ref 0.7–4.0)
MCH: 23.8 pg — ABNORMAL LOW (ref 26.0–34.0)
MCHC: 29.9 g/dL — ABNORMAL LOW (ref 30.0–36.0)
MCV: 79.6 fL — ABNORMAL LOW (ref 80.0–100.0)
Monocytes Absolute: 0.3 10*3/uL (ref 0.1–1.0)
Monocytes Relative: 4 %
Neutro Abs: 5.7 10*3/uL (ref 1.7–7.7)
Neutrophils Relative %: 80 %
Platelets: 281 10*3/uL (ref 150–400)
RBC: 5.34 MIL/uL — ABNORMAL HIGH (ref 3.87–5.11)
RDW: 15.2 % (ref 11.5–15.5)
WBC: 7.2 10*3/uL (ref 4.0–10.5)
nRBC: 0 % (ref 0.0–0.2)

## 2023-08-13 LAB — HEMOGLOBIN A1C
Hgb A1c MFr Bld: 6.7 % — ABNORMAL HIGH (ref 4.8–5.6)
Mean Plasma Glucose: 145.59 mg/dL

## 2023-08-13 LAB — URINALYSIS, ROUTINE W REFLEX MICROSCOPIC
Bacteria, UA: NONE SEEN
Bilirubin Urine: NEGATIVE
Glucose, UA: >=500 mg/dL — AB
Hgb urine dipstick: NEGATIVE
Ketones, ur: NEGATIVE mg/dL
Leukocytes,Ua: NEGATIVE
Nitrite: NEGATIVE
Protein, ur: NEGATIVE mg/dL
Specific Gravity, Urine: 1.015 (ref 1.005–1.030)
Squamous Epithelial / HPF: 0 /HPF (ref 0–5)
pH: 5 (ref 5.0–8.0)

## 2023-08-13 LAB — GLUCOSE, CAPILLARY
Glucose-Capillary: 62 mg/dL — ABNORMAL LOW (ref 70–99)
Glucose-Capillary: 104 mg/dL — ABNORMAL HIGH (ref 70–99)
Glucose-Capillary: 65 mg/dL — ABNORMAL LOW (ref 70–99)

## 2023-08-13 LAB — COMPREHENSIVE METABOLIC PANEL WITH GFR
ALT: 19 U/L (ref 0–44)
AST: 31 U/L (ref 15–41)
Albumin: 3.6 g/dL (ref 3.5–5.0)
Alkaline Phosphatase: 74 U/L (ref 38–126)
Anion gap: 9 (ref 5–15)
BUN: 13 mg/dL (ref 8–23)
CO2: 27 mmol/L (ref 22–32)
Calcium: 8.6 mg/dL — ABNORMAL LOW (ref 8.9–10.3)
Chloride: 103 mmol/L (ref 98–111)
Creatinine, Ser: 0.83 mg/dL (ref 0.44–1.00)
GFR, Estimated: >60 mL/min (ref >60)
Glucose, Bld: 197 mg/dL — ABNORMAL HIGH (ref 70–99)
Potassium: 3.4 mmol/L — ABNORMAL LOW (ref 3.5–5.1)
Sodium: 139 mmol/L (ref 135–145)
Total Bilirubin: 0.3 mg/dL (ref 0.0–1.2)
Total Protein: 6.5 g/dL (ref 6.5–8.1)

## 2023-08-13 LAB — LACTIC ACID, PLASMA: Lactic Acid, Venous: 1.7 mmol/L (ref 0.5–1.9)

## 2023-08-13 LAB — URINE DRUG SCREEN, QUALITATIVE (ARMC ONLY)
Amphetamines, Ur Screen: NOT DETECTED
Barbiturates, Ur Screen: NOT DETECTED
Benzodiazepine, Ur Scrn: NOT DETECTED
Cannabinoid 50 Ng, Ur ~~LOC~~: NOT DETECTED
Cocaine Metabolite,Ur ~~LOC~~: NOT DETECTED
MDMA (Ecstasy)Ur Screen: NOT DETECTED
Methadone Scn, Ur: NOT DETECTED
Opiate, Ur Screen: NOT DETECTED
Phencyclidine (PCP) Ur S: NOT DETECTED
Tricyclic, Ur Screen: NOT DETECTED

## 2023-08-13 LAB — CBG MONITORING, ED
Glucose-Capillary: 180 mg/dL — ABNORMAL HIGH (ref 70–99)
Glucose-Capillary: 121 mg/dL — ABNORMAL HIGH (ref 70–99)
Glucose-Capillary: 60 mg/dL — ABNORMAL LOW (ref 70–99)
Glucose-Capillary: 99 mg/dL (ref 70–99)

## 2023-08-13 LAB — ETHANOL: Alcohol, Ethyl (B): <15 mg/dL (ref <15)

## 2023-08-13 LAB — TROPONIN I (HIGH SENSITIVITY): Troponin I (High Sensitivity): 9 ng/L (ref <18)

## 2023-08-13 MED ORDER — STROKE: EARLY STAGES OF RECOVERY BOOK: Freq: Once | Status: AC

## 2023-08-13 MED ORDER — ASPIRIN 81 MG PO CHEW
81.0000 mg | CHEWABLE_TABLET | Freq: Every day | ORAL | Status: DC
  Administered 2023-08-13 – 2023-08-14 (×2): 81 mg via ORAL
  Filled 2023-08-13 (×2): qty 1

## 2023-08-13 MED ORDER — ACETAMINOPHEN 325 MG PO TABS: 650.0000 mg | ORAL_TABLET | ORAL | Status: DC | PRN

## 2023-08-13 MED ORDER — INSULIN ASPART 100 UNIT/ML IJ SOLN: 0.0000 [IU] | Freq: Three times a day (TID) | INTRAMUSCULAR | Status: DC

## 2023-08-13 MED ORDER — ENOXAPARIN SODIUM 40 MG/0.4ML IJ SOSY
40.0000 mg | PREFILLED_SYRINGE | INTRAMUSCULAR | Status: DC
  Administered 2023-08-13: 40 mg via SUBCUTANEOUS
  Filled 2023-08-13: qty 0.4

## 2023-08-13 MED ORDER — SODIUM CHLORIDE 0.9% FLUSH
3.0000 mL | Freq: Two times a day (BID) | INTRAVENOUS | Status: DC
  Administered 2023-08-13 – 2023-08-14 (×2): 10 mL via INTRAVENOUS

## 2023-08-13 MED ORDER — SODIUM CHLORIDE 0.9% FLUSH
3.0000 mL | INTRAVENOUS | Status: DC | PRN
  Administered 2023-08-13 (×2): 10 mL via INTRAVENOUS

## 2023-08-13 MED ORDER — METOPROLOL SUCCINATE ER 25 MG PO TB24
25.0000 mg | ORAL_TABLET | Freq: Every day | ORAL | Status: DC
  Administered 2023-08-14: 25 mg via ORAL
  Filled 2023-08-13: qty 1

## 2023-08-13 MED ORDER — ACETAMINOPHEN 650 MG RE SUPP: 650.0000 mg | RECTAL | Status: DC | PRN

## 2023-08-13 MED ORDER — ACETAMINOPHEN 160 MG/5ML PO SOLN: 650.0000 mg | ORAL | Status: DC | PRN

## 2023-08-13 NOTE — ED Notes (Signed)
Pt given 4 oz. Orange juice.

## 2023-08-13 NOTE — ED Triage Notes (Signed)
 Pt presents to the ED via [facility] from home with AMS and hypoglycemia. Pt's BG with fire department prior to EMS arrival was 62. They gave a tube of oral glucose and EMS rechecked sugar with BG of 40. EMS gave 25g D10. Pt with aphasia at time of triage. Oriented to person, place, and time.  Last BG with EMS 136.  BG at time of triage 180  LKW 1330 yesterday. Family saw patient at church.

## 2023-08-13 NOTE — ED Notes (Signed)
 Pt given water as requested.

## 2023-08-13 NOTE — ED Provider Notes (Signed)
 [facility] Provider Note    Event Date/Time   First MD Initiated Contact with Patient 08/13/23 1158     (approximate)   History   Hypoglycemia   HPI  Rebecca Ball is a 83 y.o. female with a history of type 2 diabetes and hypertension who presents with hypoglycemia and altered mental status.  Per EMS, the patient was found to be altered by family.  On fire department arrival the glucose was 62 and they gave oral glucose, but when EMS checked she was still hypoglycemic so they gave D10.  The patient then became more alert.  However, the patient states that she feels unwell.  She states she is dizzy and is having trouble speaking.  She was last seen by family yesterday at church around 1:30 in the afternoon.  The patient denies any acute pain.  I reviewed the past medical records.  The patient was most recently admitted to the hospitalist service in October 2023 with acute proctitis and uncontrolled diabetes.   Physical Exam   Triage Vital Signs: ED Triage Vitals  Encounter Vitals Group     BP      Systolic BP Percentile      Diastolic BP Percentile      Pulse      Resp      Temp      Temp src      SpO2      Weight      Height      Head Circumference      Peak Flow      Pain Score      Pain Loc      Pain Education      Exclude from Growth Chart     Most recent vital signs: Vitals:   08/13/23 1400 08/13/23 1500  BP: (!) 149/62 (!) 95/55  Pulse: 81 98  Resp: (!) 23 (!) 22  Temp:    SpO2: 100% 97%     General: Alert, oriented x 4, no distress.  CV:  Good peripheral perfusion.  Resp:  Normal effort.  Abd:  No distention.  Other:  EOMI.  PERRLA.  No facial droop.  Motor intact in all extremities.  No ataxia.  Expressive aphasia.   ED Results / Procedures / Treatments   Labs (all labs ordered are listed, but only abnormal results are displayed) Labs Reviewed  COMPREHENSIVE METABOLIC PANEL WITH GFR - Abnormal; Notable for the  following components:      Result Value   Potassium 3.4 (*)    Glucose, Bld 197 (*)    Calcium 8.6 (*)    All other components within normal limits  CBC WITH DIFFERENTIAL/PLATELET - Abnormal; Notable for the following components:   RBC 5.34 (*)    MCV 79.6 (*)    MCH 23.8 (*)    MCHC 29.9 (*)    All other components within normal limits  URINALYSIS, ROUTINE W REFLEX MICROSCOPIC - Abnormal; Notable for the following components:   Color, Urine YELLOW (*)    APPearance CLEAR (*)    Glucose, UA >=500 (*)    All other components within normal limits  CBG MONITORING, ED - Abnormal; Notable for the following components:   Glucose-Capillary 180 (*)    All other components within normal limits  CBG MONITORING, ED - Abnormal; Notable for the following components:   Glucose-Capillary 121 (*)    All other components within normal limits  LACTIC ACID, PLASMA  TROPONIN  I (HIGH SENSITIVITY)     EKG  ED ECG REPORT I, Lind Repine, the attending physician, personally viewed and interpreted this ECG.  Date: 08/13/2023 EKG Time: 1208 Rate: 89 Rhythm: normal sinus rhythm QRS Axis: normal Intervals: Nonspecific IVCD, prolonged QTc ST/T Wave abnormalities: Nonspecific T wave abnormalities Narrative Interpretation: no evidence of acute ischemia    RADIOLOGY  CT head: I independently viewed and interpreted the images; there is no ICH.  Radiology report indicates the following:  IMPRESSION:  1. Stable chronic small vessel ischemic changes. No acute  intracranial process.    PROCEDURES:  Critical Care performed: No  Procedures   MEDICATIONS ORDERED IN ED: Medications - No data to display   IMPRESSION / MDM / ASSESSMENT AND PLAN / ED COURSE  I reviewed the triage vital signs and the nursing notes.  83 year old female with PMH as noted above was found altered by family and was hypoglycemic.  She was given dextrose  and the blood sugar has improved.  She is alert, however  she does demonstrate some expressive aphasia.  On exam the patient is fully oriented and able to answer questions, however intermittently is unable to say a word or mixes up words.  She does not have dysarthria.  There are no other neurologic deficits on exam.  Last known well was yesterday afternoon.  Differential diagnosis includes, but is not limited to, hypoglycemia, likely insulin  induced, electrolyte abnormality, other metabolic disturbance, UTI or other infection, or acute CVA/TIA.  We will obtain CT head, lab workup, and reassess.  The patient is out of the window for code stroke activation.  Patient's presentation is most consistent with acute presentation with potential threat to life or bodily function.  The patient is on the cardiac monitor to evaluate for evidence of arrhythmia and/or significant heart rate changes.  ----------------------------------------- 3:14 PM on 08/13/2023 -----------------------------------------  Lab workup is reassuring.  CMP shows no acute electrolyte abnormalities.  Lactate is normal.  Troponin is negative.  Urinalysis does not show any evidence of UTI.  CT read is pending.  The patient's family members now here and states that this aphasia is new for her.    ----------------------------------------- 3:44 PM on 08/13/2023 -----------------------------------------  CT is negative.  I consulted and discussed the case with Dr. Alva Jewels from the hospitalist service for admission.   FINAL CLINICAL IMPRESSION(S) / ED DIAGNOSES   Final diagnoses:  Expressive aphasia  Hypoglycemia     Rx / DC Orders   ED Discharge Orders     None        Note:  This document was prepared using Dragon voice recognition software and may include unintentional dictation errors.    Lind Repine, MD 08/13/23 1544

## 2023-08-13 NOTE — ED Notes (Signed)
 Called CCMD for central monitoring at this time

## 2023-08-13 NOTE — H&P (Signed)
 History and Physical    Rebecca Ball:[id number] DOB: Jun 26, 1940 DOA: 08/13/2023 PCP: Jimmy Moulding, MD  Chief Complaint: AMS Historian: patient, niece- Tammy  HPI:  Rebecca Ball is a 83 y.o. female with a PMH significant for type 2 diabetes, insulin -dependent, HTN, dementia. At baseline, they live at home.  She was previously living with her husband who passed away on 2023-08-19.  She has had a family member stay with her since the time of his passing however, this family member left about 3 days ago.  Family members have been checking in on her every single day to help with meals and medications since then.  They also have cameras monitoring her house so they can check in on her throughout the day.  She is generally independent with her ADLs and walks independently with a [place] at baseline. Due to significant aphasia affecting patient on presentation, the bulk of her HPI is provided by her niece at bedside.  They presented from home via EMS to the ED on 08/13/2023 with AMS. Patient was last seen in person approximately 6 PM yesterday by her family members who visited for dinner.  She was observed on her home monitoring cameras as late as 10 PM yesterday evening before going to bed.  She appeared to be in her normal state of health on the cameras at that time but they were unable to view her in her bedroom as there are no cameras there for privacy.  Family members visited her this morning and found her at approximately 10:30 AM in her bed.  She was alert and mumbling incoherently on family's arrival.  Family member checked blood sugar level but it is unknown to the historian what the level was at that time.  That family member attempted to give her a couple of teaspoons of sugar which she was not able to take.  They proceeded to call EMS who discovered that her blood sugars were in the 40s and not improved with oral glucose. Patient's niece said that she had a similar episode a couple  of weeks ago when she was still staying with the patient and they were able to correct the situation with sugar. At baseline, patient was able to administer her own medications and endorses that she takes insulin  in the morning and metformin  in the evenings.  Last dose of insulin  was Sunday morning.  She is unable to confirm what her dose is but is able to confirm that she takes Lantus .  She states that she did not eat a regular diet throughout the day on Sunday so had decreased p.o. intake. Last A1c in chart was 8.7 01/2022. Patient and niece deny any exposure to toxic chemicals or ingested substances.  In the ED, it was found that they had stable vital signs.  Significant findings included: Initial CBG 180, after receiving oral glucose and bolus of D50 from EMS.  CMET showed K+ 139, K+ 3.4, glucose 197, creatinine 0.83, normal liver labs.  Troponin 9.  WBC 7.2, hemoglobin 12.7, platelets 281.  LA 1.7.  Urinalysis positive for elevated glucose and otherwise negative for signs of pyuria. Head CT shows stable chronic small vessel ischemic changes without any acute intracranial processes.  She was not given any treatments in the ED.  Patient was admitted to medicine service for further workup and management of aphasia as outlined in detail below.  Assessment/Plan Principal Problem:   Transient ischemic attack (TIA)   Aphasia-patient has normal speech at  baseline.  Throughout her conversation, she is mislabeling words with names of playing cards for example: 85, Smiley Dung.  She does appear to be able to understand most of what she is hearing and follow commands however, she does struggle with some of the commands such as unable to make a frown face when assessing her cranial nerves.  Otherwise, appears to have nonfocal neuroexam with cranial nerves intact, no dysarthria, facial asymmetries, limb weakness. - Brain MRI to rule out occult stroke - telemetry - echo - ecg - Close monitoring of  glucose. - UDS - Neurology consult as needed - SLP - continue home asa - lipid, tsh  HTN-permissive hypertension while stroke rule out is ongoing  Type 2 diabetes mellitus-patient is unable to confirm the dose of her Lantus  at home but takes it in the morning and last dose was Sunday morning. - Hold metformin  - Hold long-acting insulin  - Sensitive sliding scale - A1c pending  Past Medical History:  Diagnosis Date   Diabetes mellitus without complication (HCC)    Hypertension     Past Surgical History:  Procedure Laterality Date   ABDOMINAL HYSTERECTOMY     BACK SURGERY     IRRIGATION AND DEBRIDEMENT ABSCESS Right 07/05/2017   Procedure: IRRIGATION AND DEBRIDEMENT GLUTEAL ABSCESS;  Surgeon: Emmalene Hare, MD;  Location: [facility] ORS;  Service: General;  Laterality: Right;     reports that she has quit smoking. She has never used smokeless tobacco. She reports that she does not drink alcohol and does not use drugs.  Allergies  Allergen Reactions   Bactrim  [Sulfamethoxazole -Trimethoprim ] Rash   Penicillins Anaphylaxis   Oxycodone Nausea And Vomiting   Propoxyphene Nausea And Vomiting    Family History  Problem Relation Age of Onset   Breast cancer Paternal Aunt     Prior to Admission medications   Medication Sig Start Date End Date Taking? Authorizing Provider  amLODipine (NORVASC) 5 MG tablet  07/11/17   [provider]  aspirin  81 MG chewable tablet Chew 81 mg by mouth daily.    [provider]  insulin  glargine (LANTUS ) 100 UNIT/ML injection Inject 0.2 mLs (20 Units total) into the skin every morning. 01/20/22   Donaciano Frizzle, MD  Insulin  Pen Needle (PEN NEEDLES 31GX5/16") 31G X 8 MM MISC Use 1 Syringe 5 (five) times daily.    [provider]  Lactulose  20 GM/30ML SOLN Take 10 mLs (6.6667 g total) by mouth 2 (two) times daily as needed (constipation). 01/20/22   Donaciano Frizzle, MD  metFORMIN  (GLUCOPHAGE ) 1000 MG tablet Take 500 mg by mouth 2 (two)  times daily with a meal.    [provider]  metoCLOPramide  (REGLAN ) 10 MG tablet Take 1 tablet (10 mg total) by mouth 3 (three) times daily with meals. 01/05/22 01/05/23  Triplett, Davene Ernst B, FNP  metoprolol  succinate (TOPROL  XL) 25 MG 24 hr tablet Take 25 mg by mouth daily.    [provider]  simvastatin  (ZOCOR ) 20 MG tablet Take 20 mg by mouth daily.    [provider]  TUBERCULIN SYR 1CC/25GX5/8" 25G X 5/8" 1 ML MISC Use as directed.    [provider]   I have personally, briefly reviewed patient's prior medical records in [place] [facility]  Objective: Blood pressure (!) 95/55, pulse 98, temperature 98.2 F (36.8 C), temperature source Oral, resp. rate (!) 22, height 5\' 5"  (1.651 m), SpO2 97%.   Physical Exam Vitals and nursing note reviewed. Exam conducted with a chaperone present.  Constitutional:      General: She is not in acute distress.    Appearance: Normal appearance. She is not toxic-appearing.  HENT:     Head: Normocephalic and atraumatic.     Nose: Nose normal.     Mouth/Throat:     Mouth: Mucous membranes are moist.  Eyes:     General: No scleral icterus.    Extraocular Movements: Extraocular movements intact.     Pupils: Pupils are equal, round, and reactive to light.  Cardiovascular:     Rate and Rhythm: Normal rate and regular rhythm.     Pulses: Normal pulses.     Heart sounds: Normal heart sounds.  Pulmonary:     Effort: Pulmonary effort is normal. No respiratory distress.     Breath sounds: Normal breath sounds.  Abdominal:     General: Bowel sounds are normal. There is no distension.     Palpations: Abdomen is soft. There is no mass.     Tenderness: There is no abdominal tenderness. There is no guarding.  Musculoskeletal:        General: No swelling, tenderness or deformity.     Cervical back: Neck supple. No rigidity or tenderness.  Skin:    General: Skin is warm and dry.     Capillary Refill: Capillary refill takes  less than 2 seconds.  Neurological:     General: No focal deficit present.     Mental Status: She is alert and oriented to person, place, and time.     GCS: GCS eye subscore is 4. GCS verbal subscore is 5. GCS motor subscore is 6.     Cranial Nerves: Cranial nerve deficit present.     Sensory: No sensory deficit.     Motor: No weakness or tremor.     Coordination: Coordination normal.     Deep Tendon Reflexes: Reflexes normal.  Psychiatric:        Attention and Perception: Attention normal. She perceives visual hallucinations.        Mood and Affect: Affect is tearful.     Comments: Visual hallucinations of playing cards and numbers throughout the room.  Frequently tearful briefly when topic of her husband is brought up as he passed away a few weeks ago and they were married almost 60 years  Negative apraxia. Positive for dysphagia- both receptive and expressive    Labs on Admission: I have personally reviewed admission labs and imaging studies  CBC    Component Value Date/Time   WBC 7.2 08/13/2023 1214   RBC 5.34 (H) 08/13/2023 1214   HGB 12.7 08/13/2023 1214   HCT 42.5 08/13/2023 1214   PLT 281 08/13/2023 1214   MCV 79.6 (L) 08/13/2023 1214   MCH 23.8 (L) 08/13/2023 1214   MCHC 29.9 (L) 08/13/2023 1214   RDW 15.2 08/13/2023 1214   LYMPHSABS 1.0 08/13/2023 1214   MONOABS 0.3 08/13/2023 1214   EOSABS 0.0 08/13/2023 1214   BASOSABS 0.0 08/13/2023 1214   CMP     Component Value Date/Time   NA 139 08/13/2023 1214   K 3.4 (L) 08/13/2023 1214   CL 103 08/13/2023 1214   CO2 27 08/13/2023 1214   GLUCOSE 197 (H) 08/13/2023 1214   BUN 13 08/13/2023 1214   CREATININE 0.83 08/13/2023 1214   CALCIUM 8.6 (L) 08/13/2023 1214   PROT 6.5 08/13/2023 1214   ALBUMIN 3.6 08/13/2023 1214   AST 31 08/13/2023 1214   ALT 19 08/13/2023 1214   ALKPHOS 74 08/13/2023 1214  BILITOT 0.3 08/13/2023 1214   GFRNONAA >60 08/13/2023 1214   GFRAA 57 (L) 07/07/2017 0559    Radiological Exams  on Admission: CT Head Wo Contrast Result Date: 08/13/2023 CLINICAL DATA:  Altered level of consciousness, hypoglycemia EXAM: CT HEAD WITHOUT CONTRAST TECHNIQUE: Contiguous axial images were obtained from the base of the skull through the vertex without intravenous contrast. RADIATION DOSE REDUCTION: This exam was performed according to the departmental dose-optimization program which includes automated exposure control, adjustment of the mA and/or kV according to patient size and/or use of iterative reconstruction technique. COMPARISON:  01/05/2022 FINDINGS: Brain: Stable chronic small-vessel ischemic changes within the thalami, basal ganglia, and periventricular white matter. No evidence of acute infarct or hemorrhage. Lateral ventricles and remaining midline structures are unremarkable. No acute extra-axial fluid collections. No mass effect. Vascular: Atherosclerosis.  No hyperdense vessel. Skull: Normal. Negative for fracture or focal lesion. Sinuses/Orbits: No acute finding. Other: None. IMPRESSION: 1. Stable chronic small vessel ischemic changes. No acute intracranial process. Electronically Signed   By: Bobbye Burrow M.D.   On: 08/13/2023 15:19   EKG: not done. Will order  DVT prophylaxis: enoxaparin  (LOVENOX ) injection 40 mg Start: 08/13/23 2200  Code Status: full  Family Communication: niece with POA at bedside   Disposition Plan: admit to med tele  Consults called: none   Ree Candy, DO [facility]  08/13/2023, 4:42 PM    To contact the appropriate [facility] Attending or Consulting provider: Check [email or web address] for coverage from 7pm-7am

## 2023-08-13 NOTE — ED Notes (Signed)
Informed RN bed assigned 

## 2023-08-14 ENCOUNTER — Observation Stay

## 2023-08-14 DIAGNOSIS — E162 Hypoglycemia, unspecified: Secondary | ICD-10-CM | POA: Diagnosis not present

## 2023-08-14 DIAGNOSIS — R4701 Aphasia: Secondary | ICD-10-CM | POA: Diagnosis not present

## 2023-08-14 LAB — GLUCOSE, CAPILLARY
Glucose-Capillary: 116 mg/dL — ABNORMAL HIGH (ref 70–99)
Glucose-Capillary: 103 mg/dL — ABNORMAL HIGH (ref 70–99)
Glucose-Capillary: 258 mg/dL — ABNORMAL HIGH (ref 70–99)

## 2023-08-14 LAB — TSH: TSH: 1.67 u[IU]/mL (ref 0.350–4.500)

## 2023-08-14 LAB — LIPID PANEL
Cholesterol: 136 mg/dL (ref 0–200)
HDL: 58 mg/dL (ref >40)
LDL Cholesterol: 59 mg/dL (ref 0–99)
Total CHOL/HDL Ratio: 2.3 ratio
Triglycerides: 94 mg/dL (ref <150)
VLDL: 19 mg/dL (ref 0–40)

## 2023-08-14 NOTE — Inpatient Diabetes Management (Addendum)
 Inpatient Diabetes Program Recommendations  AACE/ADA: New Consensus Statement on Inpatient Glycemic Control (2015)  Target Ranges:  Prepandial:   less than 140 mg/dL      Peak postprandial:   less than 180 mg/dL (1-2 hours)      Critically ill patients:  140 - 180 mg/dL   Lab Results  Component Value Date   GLUCAP 258 (H) 08/14/2023   HGBA1C 6.7 (H) 08/13/2023    Review of Glycemic Control  Diabetes history: Type 2 DM Outpatient Diabetes medications: Lantus  60 units every day, Metformin  500 mg BID Current orders for Inpatient glycemic control: CBGs TID & HS  Inpatient Diabetes Program Recommendations:    Spoke with patient regarding outpatient diabetes management. Patient reports having lows frequently since the passing of her husband; has been decreasing reported dose above. The previous dose she administered was Lantus  30 units.  Reviewed patient's current A1c of 6.7%. Explained what a A1c is and what it measures. Also reviewed goal A1c with patient, importance of good glucose control @ home, and blood sugar goals. Reviewed patho of DM, frequency of checking CBGs, survival skills, interventions, hypoglycemia risk for mortality, and other commorbidities.  Patient has a meter at home and knows how to use. Does not have a smart phone and is not interested in CGM.  Emotional support provided. No additional questions at this time. Patient to follow up with PCP.  Do not recommend continuing Lantus  at this time given poor oral intake. MD to dc.  Thanks, Marjo Sievert, MSN, RNC-OB Diabetes Coordinator [phone] (8a-5p)

## 2023-08-14 NOTE — Discharge Instructions (Signed)
 Make an appointment with your primary doctor within 1-2 weeks to recheck your blood sugars and discuss if you need to continue insulin  or not.  I recommend removing insulin  from the home at this time to avoid any dangerous situations with insulin  causing low blood sugars at least until follow up.  Taking metformin  is ok.

## 2023-08-14 NOTE — Evaluation (Signed)
 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 01/18/2022   Frequent falls 01/18/2022   Adrenal mass, bilateral (HCC) 01/18/2022   Ankle edema 07/13/2017   Diarrhea, unspecified 07/13/2017   Perianal abscess 07/13/2017   DKA (diabetic ketoacidosis) (HCC) 07/03/2017   Acute renal failure (HCC)    Cellulitis and abscess of buttock 07/02/2017   Pure hypercholesterolemia 12/15/2016   Closed compression fracture of L3 vertebra (HCC) 06/08/2016   Low back pain with left-sided sciatica 06/08/2016   Osteopenia of spine 06/08/2016   Mixed hyperlipidemia 05/29/2014   Vitamin B 12 deficiency 05/29/2014   Anemia, unspecified 05/11/2014   Essential hypertension 02/26/2014   Uncontrolled type 2 diabetes mellitus with hyperglycemia, with long-term current use of insulin  (HCC) 02/26/2014   Vitamin D deficiency, unspecified 02/26/2014   Past Medical History:  Past Medical History:  Diagnosis Date   Diabetes mellitus without complication (HCC)    Hypertension    Past Surgical History:  Past Surgical History:  Procedure Laterality Date   ABDOMINAL HYSTERECTOMY     BACK SURGERY     IRRIGATION AND DEBRIDEMENT ABSCESS Right 07/05/2017   Procedure: IRRIGATION AND DEBRIDEMENT GLUTEAL ABSCESS;  Surgeon: Emmalene Hare, MD;  Location: [facility] ORS;  Service: General;  Laterality: Right;   HPI:  Pt is an 83 y.o. female who presents with aphasia, hypoglycemia and AMS. CT and MRI negative. PMH significant for type 2 diabetes, insulin -dependent, HTN, dementia.   Assessment / Plan  / Recommendation Clinical Impression  Pt seen for speech/language evaluation. Assessment completed via informal means. Pt demonstrated intact primary language ability with x1 instance of semantic paraphasia noted, otherwise WFL. Pt did benefit from extra time for auditory processing as pt HOH. Spoke with pt and MD, both agree pt is likely at speech/language baseline. No family present to confirm additional details of PLOF. SLP to sign off as pt has no acute SLP needs.    SLP Assessment  SLP Recommendation/Assessment: Patient does not need any further Speech Lanaguage Pathology Services SLP Visit Diagnosis: Cognitive communication deficit (R41.841)    Recommendations for follow up therapy are one component of a multi-disciplinary discharge planning process, led by the attending physician.  Recommendations may be updated based on patient status, additional functional criteria and insurance authorization.    Follow Up Recommendations  No SLP follow up    Assistance Recommended at Discharge  Intermittent Supervision/Assistance  Functional Status Assessment Patient has not had a recent decline in their functional status        SLP Evaluation Cognition  Overall Cognitive Status: Within Functional Limits for tasks assessed       Comprehension  Auditory Comprehension Overall Auditory Comprehension: Appears within functional limits for tasks assessed (with extra time) Interfering Components: Hearing    Expression Expression Primary Mode of Expression: Verbal Verbal Expression Overall Verbal Expression: Appears within functional limits for tasks assessed Other Verbal Expression Comments: x1 instance of semantic paraphasia; otherwise, WFL   Oral / Motor  Oral Motor/Sensory Function Overall Oral Motor/Sensory Function: Within functional limits Motor Speech Overall Motor Speech: Appears within functional limits for tasks assessed  Rebecca Ball, M.S., CCC-SLP Speech-Language  Pathologist [facility] [phone] [place])  Rebecca Ball 08/14/2023, 9:38 AM

## 2023-08-14 NOTE — Plan of Care (Signed)
 Problem: Education: Goal: Ability to describe self-care measures that may prevent or decrease complications (Diabetes Survival Skills Education) will improve 08/14/2023 1141 by Lauralyn Pollack, RN Outcome: Progressing 08/14/2023 1141 by Lauralyn Pollack, RN Outcome: Progressing Goal: Individualized Educational Video(s) 08/14/2023 1141 by Lauralyn Pollack, RN Outcome: Progressing 08/14/2023 1141 by Lauralyn Pollack, RN Outcome: Progressing   Problem: Coping: Goal: Ability to adjust to condition or change in health will improve 08/14/2023 1141 by Idalia Allbritton P, RN Outcome: Progressing 08/14/2023 1141 by Lauralyn Pollack, RN Outcome: Progressing   Problem: Fluid Volume: Goal: Ability to maintain a balanced intake and output will improve 08/14/2023 1141 by Kaulana Brindle P, RN Outcome: Progressing 08/14/2023 1141 by Lauralyn Pollack, RN Outcome: Progressing   Problem: Health Behavior/Discharge Planning: Goal: Ability to identify and utilize available resources and services will improve 08/14/2023 1141 by Steve Gregg P, RN Outcome: Progressing 08/14/2023 1141 by Lauralyn Pollack, RN Outcome: Progressing Goal: Ability to manage health-related needs will improve 08/14/2023 1141 by Daishaun Ayre P, RN Outcome: Progressing 08/14/2023 1141 by Aisha Greenberger P, RN Outcome: Progressing   Problem: Metabolic: Goal: Ability to maintain appropriate glucose levels will improve 08/14/2023 1141 by Lubna Stegeman P, RN Outcome: Progressing 08/14/2023 1141 by Maghan Jessee P, RN Outcome: Progressing   Problem: Nutritional: Goal: Maintenance of adequate nutrition will improve 08/14/2023 1141 by Ajanae Virag P, RN Outcome: Progressing 08/14/2023 1141 by Lauralyn Pollack, RN Outcome: Progressing Goal: Progress toward achieving an optimal weight will improve 08/14/2023 1141 by Ellajane Stong P, RN Outcome: Progressing 08/14/2023 1141 by Olman Yono P,  RN Outcome: Progressing   Problem: Skin Integrity: Goal: Risk for impaired skin integrity will decrease 08/14/2023 1141 by Ruhaan Nordahl P, RN Outcome: Progressing 08/14/2023 1141 by Zulma Court P, RN Outcome: Progressing   Problem: Tissue Perfusion: Goal: Adequacy of tissue perfusion will improve 08/14/2023 1141 by Kiaan Overholser P, RN Outcome: Progressing 08/14/2023 1141 by Lestine Rathke P, RN Outcome: Progressing   Problem: Education: Goal: Knowledge of disease or condition will improve 08/14/2023 1141 by Montre Harbor P, RN Outcome: Progressing 08/14/2023 1141 by Elaijah Munoz P, RN Outcome: Progressing Goal: Knowledge of secondary prevention will improve (MUST DOCUMENT ALL) 08/14/2023 1141 by Lauralyn Pollack, RN Outcome: Progressing 08/14/2023 1141 by Lauralyn Pollack, RN Outcome: Progressing Goal: Knowledge of patient specific risk factors will improve (DELETE if not current risk factor) 08/14/2023 1141 by Betty Daidone P, RN Outcome: Progressing 08/14/2023 1141 by Lauralyn Pollack, RN Outcome: Progressing   Problem: Ischemic Stroke/TIA Tissue Perfusion: Goal: Complications of ischemic stroke/TIA will be minimized 08/14/2023 1141 by Caelen Reierson P, RN Outcome: Progressing 08/14/2023 1141 by Trajon Rosete P, RN Outcome: Progressing   Problem: Coping: Goal: Will verbalize positive feelings about self 08/14/2023 1141 by Lauralyn Pollack, RN Outcome: Progressing 08/14/2023 1141 by Lauralyn Pollack, RN Outcome: Progressing Goal: Will identify appropriate support needs 08/14/2023 1141 by Tremont Gavitt P, RN Outcome: Progressing 08/14/2023 1141 by Lauralyn Pollack, RN Outcome: Progressing   Problem: Health Behavior/Discharge Planning: Goal: Ability to manage health-related needs will improve 08/14/2023 1141 by Calle Schader P, RN Outcome: Progressing 08/14/2023 1141 by Lauralyn Pollack, RN Outcome: Progressing Goal: Goals will  be collaboratively established with patient/family 08/14/2023 1141 by Lauralyn Pollack, RN Outcome: Progressing 08/14/2023 1141 by Lauralyn Pollack, RN Outcome: Progressing   Problem: Self-Care: Goal: Ability to participate in self-care as condition permits will improve 08/14/2023 1141 by Lauralyn Pollack, RN Outcome: Progressing 08/14/2023  1141 by Lauralyn Pollack, RN Outcome: Progressing Goal: Verbalization of feelings and concerns over difficulty with self-care will improve 08/14/2023 1141 by Leydy Worthey P, RN Outcome: Progressing 08/14/2023 1141 by Lauralyn Pollack, RN Outcome: Progressing Goal: Ability to communicate needs accurately will improve 08/14/2023 1141 by Daquavion Catala P, RN Outcome: Progressing 08/14/2023 1141 by Lauralyn Pollack, RN Outcome: Progressing   Problem: Nutrition: Goal: Risk of aspiration will decrease 08/14/2023 1141 by Asma Boldon P, RN Outcome: Progressing 08/14/2023 1141 by Lauralyn Pollack, RN Outcome: Progressing Goal: Dietary intake will improve 08/14/2023 1141 by Smith Mcnicholas P, RN Outcome: Progressing 08/14/2023 1141 by Lauralyn Pollack, RN Outcome: Progressing   Problem: Education: Goal: Knowledge of General Education information will improve Description: Including pain rating scale, medication(s)/side effects and non-pharmacologic comfort measures 08/14/2023 1141 by Baudelia Schroepfer P, RN Outcome: Progressing 08/14/2023 1141 by Lauralyn Pollack, RN Outcome: Progressing   Problem: Health Behavior/Discharge Planning: Goal: Ability to manage health-related needs will improve 08/14/2023 1141 by Valree Feild P, RN Outcome: Progressing 08/14/2023 1141 by Rosalina Dingwall P, RN Outcome: Progressing   Problem: Clinical Measurements: Goal: Ability to maintain clinical measurements within normal limits will improve 08/14/2023 1141 by Itzayanna Kaster P, RN Outcome: Progressing 08/14/2023 1141 by Lestine Rathke  P, RN Outcome: Progressing Goal: Will remain free from infection 08/14/2023 1141 by Kilynn Fitzsimmons P, RN Outcome: Progressing 08/14/2023 1141 by Lestine Rathke P, RN Outcome: Progressing Goal: Diagnostic test results will improve 08/14/2023 1141 by Carra Brindley P, RN Outcome: Progressing 08/14/2023 1141 by Lestine Rathke P, RN Outcome: Progressing Goal: Respiratory complications will improve 08/14/2023 1141 by Charvis Lightner P, RN Outcome: Progressing 08/14/2023 1141 by Lestine Rathke P, RN Outcome: Progressing Goal: Cardiovascular complication will be avoided 08/14/2023 1141 by Elvin Mccartin P, RN Outcome: Progressing 08/14/2023 1141 by Swain Acree P, RN Outcome: Progressing   Problem: Activity: Goal: Risk for activity intolerance will decrease 08/14/2023 1141 by Avelardo Reesman P, RN Outcome: Progressing 08/14/2023 1141 by Lestine Rathke P, RN Outcome: Progressing   Problem: Nutrition: Goal: Adequate nutrition will be maintained 08/14/2023 1141 by Makendra Vigeant P, RN Outcome: Progressing 08/14/2023 1141 by Brenda Samano P, RN Outcome: Progressing   Problem: Coping: Goal: Level of anxiety will decrease 08/14/2023 1141 by Jazzman Loughmiller P, RN Outcome: Progressing 08/14/2023 1141 by Verenis Nicosia P, RN Outcome: Progressing   Problem: Elimination: Goal: Will not experience complications related to bowel motility 08/14/2023 1141 by Cristina Mattern P, RN Outcome: Progressing 08/14/2023 1141 by Lauralyn Pollack, RN Outcome: Progressing Goal: Will not experience complications related to urinary retention 08/14/2023 1141 by Prestina Raigoza P, RN Outcome: Progressing 08/14/2023 1141 by Daley Mooradian P, RN Outcome: Progressing   Problem: Pain Managment: Goal: General experience of comfort will improve and/or be controlled 08/14/2023 1141 by Kydan Shanholtzer P, RN Outcome: Progressing 08/14/2023 1141 by Naethan Bracewell P, RN Outcome:  Progressing   Problem: Safety: Goal: Ability to remain free from injury will improve 08/14/2023 1141 by Shatonia Hoots P, RN Outcome: Progressing 08/14/2023 1141 by Abigail Marsiglia P, RN Outcome: Progressing   Problem: Skin Integrity: Goal: Risk for impaired skin integrity will decrease 08/14/2023 1141 by Shalayna Ornstein P, RN Outcome: Progressing 08/14/2023 1141 by Lauralyn Pollack, RN Outcome: Progressing

## 2023-08-14 NOTE — Care Management Obs Status (Signed)
 MEDICARE OBSERVATION STATUS NOTIFICATION   Patient Details  Name: Rebecca Ball MRN: [id number] Date of Birth: Nov 30, 1940   Medicare Observation Status Notification Given:  Rudolph Cost, CMA 08/14/2023, 1:59 PM

## 2023-08-14 NOTE — Evaluation (Signed)
 Occupational Therapy Evaluation Patient Details Name: Rebecca Ball MRN: [id number] DOB: Jan 27, 1941 Today's Date: 08/14/2023   History of Present Illness   Pt is an 83 y.o. female who presents with aphasia, hypoglycemia and AMS. CT and MRI negative. PMH significant for type 2 diabetes, insulin -dependent, HTN, dementia.     Clinical Impressions Pt was seen for OT evaluation this date. PTA, pt was living in a 1 level home with 4-5 STE with one HR. She is IND with ADLs, IADLs, driving and community mobile. Uses a RW for all mobility. She recently lost her husband and family has been staying in the home with her. Pt presents to acute OT demonstrating no current functional decline or impairment in ADL function. She demo bed mobility with MOD I, STS with MOD I to RW and ambulated in room to the sink and back using RW with MOD I. Donned socks seated EOB with MOD I/increased time. Standing oral care at sink performed with SUP/MOD I and no LOB. Pt admitted mild dizziness, however she has not eaten since yesterday and breakfast tray arrived at end of session with pt set up in recliner to eat. She has no acute OT needs and will sign off in house. Do not anticipate the need for follow up OT services upon acute hospital DC.      If plan is discharge home, recommend the following:         Functional Status Assessment   Patient has not had a recent decline in their functional status     Equipment Recommendations   None recommended by OT     Recommendations for Other Services         Precautions/Restrictions   Precautions Precautions: Fall Restrictions Weight Bearing Restrictions Per Provider Order: No     Mobility Bed Mobility Overal bed mobility: Modified Independent                  Transfers Overall transfer level: Needs assistance Equipment used: Rolling [place] (2 wheels) Transfers: Sit to/from Stand Sit to Stand: Supervision                  Balance  Overall balance assessment: Modified Independent                                         ADL either performed or assessed with clinical judgement   ADL Overall ADL's : Needs assistance/impaired     Grooming: Wash/dry face;Supervision/safety;Oral care;Standing;Modified independent                               Functional mobility during ADLs: Supervision/safety;Rolling [place] (2 wheels)       Vision         Perception         Praxis         Pertinent Vitals/Pain Pain Assessment Pain Assessment: No/denies pain     Extremity/Trunk Assessment Upper Extremity Assessment Upper Extremity Assessment: Overall WFL for tasks assessed   Lower Extremity Assessment Lower Extremity Assessment: Overall WFL for tasks assessed       Communication Communication Communication: No apparent difficulties   Cognition Arousal: Alert Behavior During Therapy: [facility] for tasks assessed/performed  Following commands: Intact       Cueing  General Comments   Cueing Techniques: Verbal cues;Gestural cues      Exercises Other Exercises Other Exercises: Edu on role of OT in acute setting.   Shoulder Instructions      Home Living Family/patient expects to be discharged to:: Private residence Living Arrangements: Spouse/significant other Available Help at Discharge: Family;Available PRN/intermittently Type of Home: House Home Access: Stairs to enter [employer] of Steps: 4-5 on both front and back, single rail at each set   Home Layout: One level     Bathroom Shower/Tub: Producer, television/film/video: Handicapped height     Home Equipment: Agricultural consultant (2 wheels);Rollator (4 wheels);Shower seat - built in;Grab bars - tub/shower          Prior Functioning/Environment Prior Level of Function : Independent/Modified Independent;Driving             Mobility Comments: Mod indep  with RW for household mobilization; denies recent fall history.  Of note, husband recently passed away; family has been staying with since ADLs Comments: IND with ADLs/IADLs, driving and community mobile    OT Problem List:     OT Treatment/Interventions:        OT Goals(Current goals can be found in the care plan section)       OT Frequency:       Co-evaluation              AM-PAC OT "6 Clicks" Daily Activity     Outcome Measure Help from another person eating meals?: None Help from another person taking care of personal grooming?: None Help from another person toileting, which includes using toliet, bedpan, or urinal?: None Help from another person bathing (including washing, rinsing, drying)?: None Help from another person to put on and taking off regular upper body clothing?: None Help from another person to put on and taking off regular lower body clothing?: None 6 Click Score: 24   End of Session Equipment Utilized During Treatment: Rolling [place] (2 wheels) Nurse Communication: Mobility status  Activity Tolerance: Patient tolerated treatment well Patient left: in chair;with call bell/phone within reach;with chair alarm set  OT Visit Diagnosis: Other abnormalities of gait and mobility (R26.89)                Time: 1610-9604 OT Time Calculation (min): 26 min Charges:  OT General Charges $OT Visit: 1 Visit OT Evaluation $OT Eval Low Complexity: 1 Low OT Treatments $Self Care/Home Management : 8-22 mins Maylyn Narvaiz, OTR/L  08/14/23, 11:52 AM  Leonard Raker 08/14/2023, 11:49 AM

## 2023-08-14 NOTE — Plan of Care (Signed)
 The patient shows no s/s of acute distress noted.

## 2023-08-14 NOTE — Plan of Care (Signed)
 No s/s of acute distress noted.

## 2023-08-14 NOTE — Plan of Care (Signed)
  Problem: Education: Goal: Ability to describe self-care measures that may prevent or decrease complications (Diabetes Survival Skills Education) will improve Outcome: Progressing   Problem: Coping: Goal: Ability to adjust to condition or change in health will improve Outcome: Progressing   Problem: Metabolic: Goal: Ability to maintain appropriate glucose levels will improve Outcome: Progressing   Problem: Nutritional: Goal: Progress toward achieving an optimal weight will improve Outcome: Progressing

## 2023-08-14 NOTE — Discharge Summary (Signed)
 Physician Discharge Summary  Patient: Rebecca Ball ZOX:[id number] DOB: 06/22/1940   Code Status: Full Code Admit date: 08/13/2023 Discharge date: 08/14/2023 Disposition: Home, PT PCP: Jimmy Moulding, MD  Recommendations for Outpatient Follow-up:  Follow up with PCP within 1-2 weeks Regarding general hospital follow up and preventative care Recommend continuing to hold any insulin . A1c goal of 8 to avoid further episodes of hypoglycemia Consider cognitive assessment when fully recovered  Discharge Diagnoses:  Principal Problem:   Transient ischemic attack (TIA) Active Problems:   Hypoglycemia   Expressive aphasia  Brief Hospital Course Summary: Rebecca Ball is a 83 y.o. female with a PMH significant for type 2 diabetes, insulin -dependent, HTN, dementia. At baseline, they live at home.  She was previously living with her husband who passed away on August 04, 2023.  She has had a family member stay with her since the time of his passing however, this family member left about 3 days ago.  Family members have been checking in on her every single day to help with meals and medications since then.  They also have cameras monitoring her house so they can check in on her throughout the day.  She is generally independent with her ADLs and walks independently with a Brackin at baseline. Due to significant aphasia affecting patient on presentation, the bulk of her HPI is provided by her niece at bedside.   They presented from home via EMS to the ED on 08/13/2023 with AMS. Patient was last seen in person approximately 6 PM yesterday by her family members who visited for dinner.  She was observed on her home monitoring cameras as late as 10 PM yesterday evening before going to bed.  She appeared to be in her normal state of health on the cameras at that time but they were unable to view her in her bedroom as there are no cameras there for privacy.  Family members visited her this morning and found  her at approximately 10:30 AM in her bed.  She was alert and mumbling incoherently on family's arrival.  Family member checked blood sugar level but it is unknown to the historian what the level was at that time.  That family member attempted to give her a couple of teaspoons of sugar which she was not able to take.  They proceeded to call EMS who discovered that her blood sugars were in the 40s and not improved with oral glucose. Patient's niece said that she had a similar episode a couple of weeks ago when she was still staying with the patient and they were able to correct the situation with sugar. At baseline, patient was able to administer her own medications and endorses that she takes insulin  in the morning and metformin  in the evenings.  Last dose of insulin  was Sunday morning.  She is unable to confirm what her dose is but is able to confirm that she takes Lantus .  She states that she did not eat a regular diet throughout the day on Sunday so had decreased p.o. intake. Last A1c in chart was 8.7 01/2022. Patient and niece deny any exposure to toxic chemicals or ingested substances.   In the ED, it was found that they had stable vital signs.  Significant findings included: Initial CBG 180, after receiving oral glucose and bolus of D50 from EMS.  CMET showed K+ 139, K+ 3.4, glucose 197, creatinine 0.83, normal liver labs.  Troponin 9.  WBC 7.2, hemoglobin 12.7, platelets 281.  LA 1.7.  Urinalysis positive for elevated glucose and otherwise negative for signs of pyuria. Head CT shows stable chronic small vessel ischemic changes without any acute intracranial processes. Brain MRI was also positive for Moderately advanced cerebral atrophy with chronic small vessel ischemic disease, with a few scattered remote lacunar infarcts involving the left thalamus and cerebellum but did not show any acute intracranial abnormalities.    She was not given any treatments in the ED.  On my next evaluation the  following morning, she had returned to her baseline speech pattern and mental status. Speech therapy evaluated and did not recommend any further evaluation.   Her hgb A1c was 6.7. blood sugars after admission remained in low 100s and even dipped down to 65 overnight corrected with oral intake. Due to her age and unreliably taking medications in settings of acute grief and dementia as well as several events of hypoglycemia, I do not recommend she continue insulin  at this time. It was discussed with patient and family member. She may continue to take her metformin  as prescribed. Goal A1c would be 8 before considering insulin  restarting or sooner if decided with discussion with her PCP.   All other chronic conditions were treated with home medications.    Also evaluated by PT/OT and short course of HH PT was recommended and ordered at dc.   Discharge Condition: Good, improved Recommended discharge diet: Regular healthy diet  Consultations: None   Procedures/Studies: Brain MRI  Allergies as of 08/14/2023       Reactions   Bactrim  [sulfamethoxazole -trimethoprim ] Rash   Penicillins Anaphylaxis   Oxycodone Nausea And Vomiting   Propoxyphene Nausea And Vomiting        Medication List     STOP taking these medications    insulin  glargine 100 UNIT/ML injection Commonly known as: LANTUS    metoCLOPramide  10 MG tablet Commonly known as: REGLAN    PEN NEEDLES 31GX5/16" 31G X 8 MM Misc   TUBERCULIN SYR 1CC/25GX5/8" 25G X 5/8" 1 ML Misc       TAKE these medications    amLODipine 5 MG tablet Commonly known as: NORVASC   aspirin  81 MG chewable tablet Chew 81 mg by mouth daily.   Lactulose  20 GM/30ML Soln Take 10 mLs (6.6667 g total) by mouth 2 (two) times daily as needed (constipation).   metFORMIN  1000 MG tablet Commonly known as: GLUCOPHAGE  Take 500 mg by mouth 2 (two) times daily with a meal.   simvastatin  20 MG tablet Commonly known as: ZOCOR  Take 20 mg by mouth  daily.   Toprol  XL 25 MG 24 hr tablet Generic drug: metoprolol  succinate Take 25 mg by mouth daily.        Follow-up Information     Jimmy Moulding, MD. Schedule an appointment as soon as possible for a visit in 1 week(s).   Specialty: Internal Medicine Contact information: [address] [facility] [address] [place] [address] [phone]                 Subjective   Pt reports feeling well. Denies hallucinations, headache, CP, SOB. She states that her speech is normal for her. She is again tearful intermittently when the topic of her husband comes up. She is oriented.   All questions and concerns were addressed at time of discharge.  Objective  Blood pressure (!) 143/82, pulse 95, temperature 98.9 F (37.2 C), resp. rate 17, height 5\' 5"  (1.651 m), weight 84.8 kg, SpO2 98%.   General: Pt is  alert, awake, not in acute distress Cardiovascular: RRR, S1/S2 +, no rubs, no gallops Respiratory: CTA bilaterally, no wheezing, no rhonchi Abdominal: Soft, NT, ND, bowel sounds + Extremities: no edema, no cyanosis  The results of significant diagnostics from this hospitalization (including imaging, microbiology, ancillary and laboratory) are listed below for reference.   Imaging studies: MR BRAIN WO CONTRAST Result Date: 08/14/2023 CLINICAL DATA:  Initial evaluation for acute neuro deficit, stroke suspected. EXAM: MRI HEAD WITHOUT CONTRAST TECHNIQUE: Multiplanar, multiecho pulse sequences of the brain and surrounding structures were obtained without intravenous contrast. COMPARISON:  Prior CT from earlier the same day. FINDINGS: Brain: Diffuse prominence of the CSF containing spaces compatible generalized cerebral atrophy. Patchy and confluent T2/FLAIR hyperintensity involving the periventricular deep white matter both cerebral hemispheres as well as the pons, consistent with chronic small vessel ischemic disease, moderately advanced in nature. Remote left  thalamic lacunar infarct. Few scattered small remote cerebellar infarcts noted. No abnormal foci of restricted diffusion to suggest acute or subacute ischemia. Gray-white matter differentiation maintained. No areas of chronic cortical infarction. No acute intracranial hemorrhage. Single chronic microhemorrhage noted at the right frontal lobe, of doubtful significance in isolation. No mass lesion, midline shift or mass effect. Ventricular prominence related to global parenchymal volume loss of hydrocephalus. No extra-axial fluid collection. Pituitary gland within normal limits. Vascular: Major intracranial vascular flow voids are maintained. Skull and upper cervical spine: Craniocervical junction within normal limits. Bone marrow signal intensity normal. No scalp soft tissue abnormality. Hyperostosis frontalis interna noted. Sinuses/Orbits: Prior bilateral ocular lens replacement. Paranasal sinuses are clear. Trace right mastoid effusion noted, of doubtful significance. Other: None. IMPRESSION: 1. No acute intracranial abnormality. 2. Moderately advanced cerebral atrophy with chronic small vessel ischemic disease, with a few scattered remote lacunar infarcts involving the left thalamus and cerebellum. Electronically Signed   By: Virgia Griffins M.D.   On: 08/14/2023 01:21   CT Head Wo Contrast Result Date: 08/13/2023 CLINICAL DATA:  Altered level of consciousness, hypoglycemia EXAM: CT HEAD WITHOUT CONTRAST TECHNIQUE: Contiguous axial images were obtained from the base of the skull through the vertex without intravenous contrast. RADIATION DOSE REDUCTION: This exam was performed according to the departmental dose-optimization program which includes automated exposure control, adjustment of the mA and/or kV according to patient size and/or use of iterative reconstruction technique. COMPARISON:  01/05/2022 FINDINGS: Brain: Stable chronic small-vessel ischemic changes within the thalami, basal ganglia, and  periventricular white matter. No evidence of acute infarct or hemorrhage. Lateral ventricles and remaining midline structures are unremarkable. No acute extra-axial fluid collections. No mass effect. Vascular: Atherosclerosis.  No hyperdense vessel. Skull: Normal. Negative for fracture or focal lesion. Sinuses/Orbits: No acute finding. Other: None. IMPRESSION: 1. Stable chronic small vessel ischemic changes. No acute intracranial process. Electronically Signed   By: Bobbye Burrow M.D.   On: 08/13/2023 15:19    Labs: Basic Metabolic Panel: Recent Labs  Lab 08/13/23 1214  NA 139  K 3.4*  CL 103  CO2 27  GLUCOSE 197*  BUN 13  CREATININE 0.83  CALCIUM 8.6*   CBC: Recent Labs  Lab 08/13/23 1214  WBC 7.2  NEUTROABS 5.7  HGB 12.7  HCT 42.5  MCV 79.6*  PLT 281   Microbiology: Results for orders placed or performed during the hospital encounter of 01/05/22  Resp Panel by RT-PCR (Flu A&B, Covid) Anterior Nasal Swab     Status: None   Collection Time: 01/05/22 11:24 AM   Specimen: Anterior Nasal Swab  Result Value Ref  Range Status   SARS Coronavirus 2 by RT PCR NEGATIVE NEGATIVE Final    Comment: (NOTE) SARS-CoV-2 target nucleic acids are NOT DETECTED.  The SARS-CoV-2 RNA is generally detectable in upper respiratory specimens during the acute phase of infection. The lowest concentration of SARS-CoV-2 viral copies this assay can detect is 138 copies/mL. A negative result does not preclude SARS-Cov-2 infection and should not be used as the sole basis for treatment or other patient management decisions. A negative result may occur with  improper specimen collection/handling, submission of specimen other than nasopharyngeal swab, presence of viral mutation(s) within the areas targeted by this assay, and inadequate number of viral copies(<138 copies/mL). A negative result must be combined with clinical observations, patient history, and epidemiological information. The expected  result is Negative.  Fact Sheet for Patients:  BloggerCourse.com  Fact Sheet for Healthcare Providers:  SeriousBroker.it  This test is no t yet approved or cleared by the [place]  FDA and  has been authorized for detection and/or diagnosis of SARS-CoV-2 by FDA under an Emergency Use Authorization (EUA). This EUA will remain  in effect (meaning this test can be used) for the duration of the COVID-19 declaration under Section 564(b)(1) of the Act, 21 U.S.C.section 360bbb-3(b)(1), unless the authorization is terminated  or revoked sooner.       Influenza A by PCR NEGATIVE NEGATIVE Final   Influenza B by PCR NEGATIVE NEGATIVE Final    Comment: (NOTE) The Xpert Xpress SARS-CoV-2/FLU/RSV plus assay is intended as an aid in the diagnosis of influenza from Nasopharyngeal swab specimens and should not be used as a sole basis for treatment. Nasal washings and aspirates are unacceptable for Xpert Xpress SARS-CoV-2/FLU/RSV testing.  Fact Sheet for Patients: BloggerCourse.com  Fact Sheet for Healthcare Providers: SeriousBroker.it  This test is not yet approved or cleared by the [place]  FDA and has been authorized for detection and/or diagnosis of SARS-CoV-2 by FDA under an Emergency Use Authorization (EUA). This EUA will remain in effect (meaning this test can be used) for the duration of the COVID-19 declaration under Section 564(b)(1) of the Act, 21 U.S.C. section 360bbb-3(b)(1), unless the authorization is terminated or revoked.  Performed at [facility], [address] [place], [place] [address]     Time coordinating discharge: Over 30 minutes  Ree Candy, MD  [facility] 08/14/2023, 12:26 PM

## 2023-08-14 NOTE — Evaluation (Signed)
 Physical Therapy Evaluation Patient Details Name: Rebecca Ball MRN: [id number] DOB: [birth year] Today's Date: 08/14/2023  History of Present Illness  Pt is an 83 y.o. female who presents with aphasia, hypoglycemia and AMS. CT and MRI negative. PMH significant for type 2 diabetes, insulin -dependent, HTN, dementia.  Clinical Impression  Patient seated in recliner beginning/end of treatment session; alert and oriented to basic information, follows simple commands, pleasant and cooperative throughout session.  Eager for mobility efforts and progression towards discharge home.  Denies pain.  Bilat UE/LE strength and ROM grossly symmetrical and WFL; no focal weakness appreciated.  Able to complete sit/stand, basic transfers and gait (220') with RW, close sup; demonstrates reciprocal stepping pattern with fair step height/length, fair cadence; completes simple head turns, start/stop and change of direction without buckling, LOB or difficulty. Would benefit from skilled PT to address above deficits and promote optimal return to PLOF.; recommend post-acute PT follow up as indicated by interdisciplinary care team.             If plan is discharge home, recommend the following: A little help with walking and/or transfers;A little help with bathing/dressing/bathroom   Can travel by private vehicle        Equipment Recommendations None recommended by PT  Recommendations for Other Services       Functional Status Assessment Patient has had a recent decline in their functional status and demonstrates the ability to make significant improvements in function in a reasonable and predictable amount of time.     Precautions / Restrictions Precautions Precautions: Fall Restrictions Weight Bearing Restrictions Per Provider Order: No      Mobility  Bed Mobility               General bed mobility comments: seated in recliner beginning/end of treatment session    Transfers Overall transfer  level: Needs assistance Equipment used: Rolling Aarons (2 wheels) Transfers: Sit to/from Stand Sit to Stand: Supervision                Ambulation/Gait Ambulation/Gait assistance: Supervision Gait Distance (Feet): 220 Feet Assistive device: Rolling Crossno (2 wheels)   Gait velocity: 10' walk time, 11-12 seconds     General Gait Details: reciprocal stepping pattern with fair step height/length, fair cadence; completes simple head turns, start/stop and change of direction without buckling, LOB or difficulty.  Stairs            Wheelchair Mobility     Tilt Bed    Modified Rankin (Stroke Patients Only)       Balance Overall balance assessment: Needs assistance Sitting-balance support: No upper extremity supported, Feet supported Sitting balance-Leahy Scale: Good     Standing balance support: Bilateral upper extremity supported Standing balance-Leahy Scale: Good                               Pertinent Vitals/Pain Pain Assessment Pain Assessment: No/denies pain    Home Living Family/patient expects to be discharged to:: Private residence Living Arrangements: Spouse/significant other Available Help at Discharge: Family;Available PRN/intermittently Type of Home: House Home Access: Stairs to enter   [employer] of Steps: 4-5 on both front and back, single rail at each set   Home Layout: One level Home Equipment: Agricultural consultant (2 wheels);Rollator (4 wheels);Shower seat - built in;Grab bars - tub/shower      Prior Function Prior Level of Function : Independent/Modified Independent;Driving  Mobility Comments: Mod indep with RW for household mobilization; denies recent fall history.  Of note, husband recently passed away; family has been staying with since ADLs Comments: IND with ADLs/IADLs, driving and community mobile     Extremity/Trunk Assessment   Upper Extremity Assessment Upper Extremity Assessment: Overall  WFL for tasks assessed    Lower Extremity Assessment Lower Extremity Assessment: Overall WFL for tasks assessed       Communication   Communication Communication: No apparent difficulties    Cognition Arousal: Alert Behavior During Therapy: WFL for tasks assessed/performed   PT - Cognitive impairments: History of cognitive impairments                       PT - Cognition Comments: Alert and oriented to self, date, location and situation; follows commands; pleasant and cooperative Following commands: Intact       Cueing Cueing Techniques: Verbal cues, Gestural cues     General Comments      Exercises     Assessment/Plan    PT Assessment Patient needs continued PT services  PT Problem List Decreased strength;Decreased range of motion;Decreased balance;Decreased activity tolerance;Decreased mobility;Decreased coordination;Decreased cognition;Decreased knowledge of use of DME;Decreased safety awareness;Decreased knowledge of precautions       PT Treatment Interventions DME instruction;Gait training;Stair training;Functional mobility training;Therapeutic activities;Therapeutic exercise;Balance training;Cognitive remediation;Patient/family education    PT Goals (Current goals can be found in the Care Plan section)  Acute Rehab PT Goals Patient Stated Goal: to return home PT Goal Formulation: With patient Time For Goal Achievement: 08/28/23 Potential to Achieve Goals: Good    Frequency Min 1X/week     Co-evaluation               AM-PAC PT "6 Clicks" Mobility  Outcome Measure Help needed turning from your back to your side while in a flat bed without using bedrails?: None Help needed moving from lying on your back to sitting on the side of a flat bed without using bedrails?: None Help needed moving to and from a bed to a chair (including a wheelchair)?: None Help needed standing up from a chair using your arms (e.g., wheelchair or bedside chair)?:  None Help needed to walk in hospital room?: None Help needed climbing 3-5 steps with a railing? : A Little 6 Click Score: 23    End of Session Equipment Utilized During Treatment: Gait belt Activity Tolerance: Patient tolerated treatment well Patient left: in chair;with call bell/phone within reach;with chair alarm set Nurse Communication: Mobility status PT Visit Diagnosis: Muscle weakness (generalized) (M62.81);Difficulty in walking, not elsewhere classified (R26.2)    Time: 8295-6213 PT Time Calculation (min) (ACUTE ONLY): 17 min   Charges:   PT Evaluation $PT Eval Low Complexity: 1 Low   PT General Charges $$ ACUTE PT VISIT: 1 Visit        Nannette Zill H. Bevin Bucks, PT, DPT, NCS 08/14/23, 11:54 AM [phone]
